# Patient Record
Sex: Female | Born: 1970 | Race: Black or African American | Hispanic: No | Smoking: Current every day smoker
Health system: Southern US, Community
[De-identification: ages and names within clinical notes are randomized; demographics above are authoritative.]

## PROBLEM LIST (undated history)

## (undated) DIAGNOSIS — F419 Anxiety disorder, unspecified: Secondary | ICD-10-CM

## (undated) DIAGNOSIS — F32A Depression, unspecified: Secondary | ICD-10-CM

## (undated) DIAGNOSIS — I10 Essential (primary) hypertension: Secondary | ICD-10-CM

## (undated) DIAGNOSIS — M545 Low back pain, unspecified: Secondary | ICD-10-CM

## (undated) DIAGNOSIS — M25511 Pain in right shoulder: Secondary | ICD-10-CM

## (undated) DIAGNOSIS — F329 Major depressive disorder, single episode, unspecified: Secondary | ICD-10-CM

## (undated) DIAGNOSIS — G8929 Other chronic pain: Secondary | ICD-10-CM

## (undated) DIAGNOSIS — G4730 Sleep apnea, unspecified: Secondary | ICD-10-CM

## (undated) DIAGNOSIS — R3915 Urgency of urination: Secondary | ICD-10-CM

## (undated) DIAGNOSIS — R519 Headache, unspecified: Secondary | ICD-10-CM

## (undated) DIAGNOSIS — R51 Headache: Secondary | ICD-10-CM

## (undated) HISTORY — DX: Anxiety disorder, unspecified: F41.9

## (undated) HISTORY — PX: TUBAL LIGATION: SHX77

## (undated) HISTORY — DX: Major depressive disorder, single episode, unspecified: F32.9

## (undated) HISTORY — DX: Depression, unspecified: F32.A

## (undated) HISTORY — PX: OTHER SURGICAL HISTORY: SHX169

---

## 2003-01-31 ENCOUNTER — Other Ambulatory Visit: Admission: RE | Admit: 2003-01-31 | Discharge: 2003-01-31 | Payer: Self-pay | Admitting: Obstetrics and Gynecology

## 2005-02-16 ENCOUNTER — Other Ambulatory Visit: Admission: RE | Admit: 2005-02-16 | Discharge: 2005-02-16 | Payer: Self-pay | Admitting: Gynecology

## 2005-09-24 ENCOUNTER — Inpatient Hospital Stay (HOSPITAL_COMMUNITY): Admission: AD | Admit: 2005-09-24 | Discharge: 2005-09-24 | Payer: Self-pay | Admitting: Obstetrics and Gynecology

## 2005-11-22 ENCOUNTER — Inpatient Hospital Stay (HOSPITAL_COMMUNITY): Admission: AD | Admit: 2005-11-22 | Discharge: 2005-11-24 | Payer: Self-pay | Admitting: Obstetrics and Gynecology

## 2005-12-21 ENCOUNTER — Other Ambulatory Visit: Admission: RE | Admit: 2005-12-21 | Discharge: 2005-12-21 | Payer: Self-pay | Admitting: Obstetrics and Gynecology

## 2006-09-02 ENCOUNTER — Emergency Department (HOSPITAL_COMMUNITY): Admission: EM | Admit: 2006-09-02 | Discharge: 2006-09-03 | Payer: Self-pay | Admitting: Emergency Medicine

## 2014-01-10 ENCOUNTER — Emergency Department (HOSPITAL_COMMUNITY): Payer: Self-pay

## 2014-01-10 ENCOUNTER — Encounter (HOSPITAL_COMMUNITY): Payer: Self-pay | Admitting: Emergency Medicine

## 2014-01-10 DIAGNOSIS — M722 Plantar fascial fibromatosis: Secondary | ICD-10-CM | POA: Insufficient documentation

## 2014-01-10 DIAGNOSIS — I10 Essential (primary) hypertension: Secondary | ICD-10-CM | POA: Insufficient documentation

## 2014-01-10 DIAGNOSIS — F17200 Nicotine dependence, unspecified, uncomplicated: Secondary | ICD-10-CM | POA: Insufficient documentation

## 2014-01-10 NOTE — ED Notes (Signed)
Pt report pain that has progressively gotten worse over the last 2 weeks. Pain more noted to heel on L foot. Pt denies any injury to area. Pt alert and ambulatory.

## 2014-01-11 ENCOUNTER — Emergency Department (HOSPITAL_COMMUNITY)
Admission: EM | Admit: 2014-01-11 | Discharge: 2014-01-11 | Disposition: A | Discharge status: Home / Self Care | Payer: Self-pay | Attending: Emergency Medicine | Admitting: Emergency Medicine

## 2014-01-11 DIAGNOSIS — M722 Plantar fascial fibromatosis: Secondary | ICD-10-CM

## 2014-01-11 HISTORY — DX: Essential (primary) hypertension: I10

## 2014-01-11 MED ORDER — HYDROCODONE-ACETAMINOPHEN 5-325 MG PO TABS: 2.0000 | ORAL_TABLET | ORAL | Status: DC | PRN

## 2014-01-11 MED ORDER — IBUPROFEN 800 MG PO TABS: 800.0000 mg | ORAL_TABLET | Freq: Three times a day (TID) | ORAL | Status: DC

## 2014-01-11 NOTE — ED Provider Notes (Signed)
CSN: [id number]     Arrival date & time 01/10/14  2217 History   First MD Initiated Contact with Patient 01/11/14 0011     Chief Complaint  Patient presents with  . Foot Pain     (Consider location/radiation/quality/duration/timing/severity/associated sxs/prior Treatment) HPI History provided by pt.   Pt has had pain on plantar surface of L heel for the past 2 weeks.  Gradually worsening.  Most severe in am when she first bears weight.  No associated fever, skin changes, paresthesias. Denies trauma.  Has never had pain like this before.   Past Medical History  Diagnosis Date  . Hypertension    History reviewed. No pertinent past surgical history. History reviewed. No pertinent family history. History  Substance Use Topics  . Smoking status: Current Some Day Smoker    Types: Cigarettes  . Smokeless tobacco: Not on file  . Alcohol Use: Yes   OB History   Grav Para Term Preterm Abortions TAB SAB Ect Mult Living                 Review of Systems  All other systems reviewed and are negative.      Allergies  Review of patient's allergies indicates not on file.  Home Medications   Current Outpatient Rx  Name  Route  Sig  Dispense  Refill  . HYDROcodone-acetaminophen (NORCO/VICODIN) 5-325 MG per tablet   Oral   Take 2 tablets by mouth every 4 (four) hours as needed.   20 tablet   0   . ibuprofen (ADVIL,MOTRIN) 800 MG tablet   Oral   Take 1 tablet (800 mg total) by mouth 3 (three) times daily.   12 tablet   0    BP 124/78  Pulse 73  Temp(Src) 98.5 F (36.9 C) (Oral)  Resp 20  SpO2 98%  LMP 12/20/2013 Physical Exam  Nursing note and vitals reviewed. Constitutional: She is oriented to person, place, and time. She appears well-developed and well-nourished. No distress.  HENT:  Head: Normocephalic and atraumatic.  Eyes:  Normal appearance  Neck: Normal range of motion.  Pulmonary/Chest: Effort normal.  Musculoskeletal: Normal range of motion.  No skin  changes or edema L foot.  Nml arch. Tenderness plantar surface of L foot from calcaneous to base of metatarsals.  Pain w/ passive dorsiflexion.  2+ DP pulse and distal sensation intact.    Neurological: She is alert and oriented to person, place, and time.  Psychiatric: She has a normal mood and affect. Her behavior is normal.    ED Course  Procedures (including critical care time) Labs Review Labs Reviewed - No data to display Imaging Review Dg Foot Complete Left  01/10/2014   CLINICAL DATA:  Foot pain, worse at heel.  EXAM: LEFT FOOT - COMPLETE 3+ VIEW  COMPARISON:  None available.  FINDINGS: There is no evidence of fracture or dislocation. There is no evidence of arthropathy or other focal bone abnormality. Soft tissues are unremarkable. Posterior and plantar calcaneal enthesophytes are noted  IMPRESSION: 1. No acute fracture or dislocation. 2. Posterior and plantar calcaneal degenerative enthesophytes.   Electronically Signed   By: Rise MuBenjamin  McClintock M.D.   On: 01/10/2014 23:48     EKG Interpretation None      MDM   Final diagnoses:  Plantar fasciitis    43yo F presents w/ non-traumatic L foot pain x 1 week.  History and exam most consistent w/ plantar fasciitis.  Xray shows bone spurs of calcaneous.  Prescribed  vicodin for severe pain and recommended NSAID, ice, stretching exercises, supportive footwear.  Ortho tech provided her with crutches as well. Referred to ortho for persistent/worsening sx.      Otilio Miu, PA-C 01/11/14 [phone]

## 2014-01-11 NOTE — Discharge Instructions (Signed)
Take vicodin as prescribed for severe pain.  Do not drive within four hours of taking this medication (may cause drowsiness or confusion).  Take ibuprofen as well; up to 800mg  three times a day with food.   Ice 2-3 times a day for 15-20 minutes. Elevate as often as possible.  Wear supportive shoes at all times.  Follow up with the orthopedist if your pain has not started to improve in 5-7 days, or you develop weakness of the injured joint.    You may return to the ER if symptoms worsen or you have any other concerns.    Plantar Fasciitis (Heel Spur Syndrome) with Rehab The plantar fascia is a fibrous, ligament-like, soft-tissue structure that spans the bottom of the foot. Plantar fasciitis is a condition that causes pain in the foot due to inflammation of the tissue. SYMPTOMS   Pain and tenderness on the underneath side of the foot.  Pain that worsens with standing or walking. CAUSES  Plantar fasciitis is caused by irritation and injury to the plantar fascia on the underneath side of the foot. Common mechanisms of injury include:  Direct trauma to bottom of the foot.  Damage to a small nerve that runs under the foot where the main fascia attaches to the heel bone.  Stress placed on the plantar fascia due to bone spurs. RISK INCREASES WITH:   Activities that place stress on the plantar fascia (running, jumping, pivoting, or cutting).  Poor strength and flexibility.  Improperly fitted shoes.  Tight calf muscles.  Flat feet.  Failure to warm-up properly before activity.  Obesity. PREVENTION  Warm up and stretch properly before activity.  Allow for adequate recovery between workouts.  Maintain physical fitness:  Strength, flexibility, and endurance.  Cardiovascular fitness.  Maintain a health body weight.  Avoid stress on the plantar fascia.  Wear properly fitted shoes, including arch supports for individuals who have flat feet. PROGNOSIS  If treated properly, then the  symptoms of plantar fasciitis usually resolve without surgery. However, occasionally surgery is necessary. RELATED COMPLICATIONS   Recurrent symptoms that may result in a chronic condition.  Problems of the lower back that are caused by compensating for the injury, such as limping.  Pain or weakness of the foot during push-off following surgery.  Chronic inflammation, scarring, and partial or complete fascia tear, occurring more often from repeated injections. TREATMENT  Treatment initially involves the use of ice and medication to help reduce pain and inflammation. The use of strengthening and stretching exercises may help reduce pain with activity, especially stretches of the Achilles tendon. These exercises may be performed at home or with a therapist. Your caregiver may recommend that you use heel cups of arch supports to help reduce stress on the plantar fascia. Occasionally, corticosteroid injections are given to reduce inflammation. If symptoms persist for greater than 6 months despite non-surgical (conservative), then surgery may be recommended.  MEDICATION   If pain medication is necessary, then nonsteroidal anti-inflammatory medications, such as aspirin and ibuprofen, or other minor pain relievers, such as acetaminophen, are often recommended.  Do not take pain medication within 7 days before surgery.  Prescription pain relievers may be given if deemed necessary by your caregiver. Use only as directed and only as much as you need.  Corticosteroid injections may be given by your caregiver. These injections should be reserved for the most serious cases, because they may only be given a certain number of times. HEAT AND COLD  Cold treatment (icing) relieves  pain and reduces inflammation. Cold treatment should be applied for 10 to 15 minutes every 2 to 3 hours for inflammation and pain and immediately after any activity that aggravates your symptoms. Use ice packs or massage the area  with a piece of ice (ice massage).  Heat treatment may be used prior to performing the stretching and strengthening activities prescribed by your caregiver, physical therapist, or athletic trainer. Use a heat pack or soak the injury in warm water. SEEK IMMEDIATE MEDICAL CARE IF:  Treatment seems to offer no benefit, or the condition worsens.  Any medications produce adverse side effects. EXERCISES RANGE OF MOTION (ROM) AND STRETCHING EXERCISES - Plantar Fasciitis (Heel Spur Syndrome) These exercises may help you when beginning to rehabilitate your injury. Your symptoms may resolve with or without further involvement from your physician, physical therapist or athletic trainer. While completing these exercises, remember:   Restoring tissue flexibility helps normal motion to return to the joints. This allows healthier, less painful movement and activity.  An effective stretch should be held for at least 30 seconds.  A stretch should never be painful. You should only feel a gentle lengthening or release in the stretched tissue. RANGE OF MOTION - Toe Extension, Flexion  Sit with your right / left leg crossed over your opposite knee.  Grasp your toes and gently pull them back toward the top of your foot. You should feel a stretch on the bottom of your toes and/or foot.  Hold this stretch for __________ seconds.  Now, gently pull your toes toward the bottom of your foot. You should feel a stretch on the top of your toes and or foot.  Hold this stretch for __________ seconds. Repeat __________ times. Complete this stretch __________ times per day.  RANGE OF MOTION - Ankle Dorsiflexion, Active Assisted  Remove shoes and sit on a chair that is preferably not on a carpeted surface.  Place right / left foot under knee. Extend your opposite leg for support.  Keeping your heel down, slide your right / left foot back toward the chair until you feel a stretch at your ankle or calf. If you do not  feel a stretch, slide your bottom forward to the edge of the chair, while still keeping your heel down.  Hold this stretch for __________ seconds. Repeat __________ times. Complete this stretch __________ times per day.  STRETCH  Gastroc, Standing  Place hands on wall.  Extend right / left leg, keeping the front knee somewhat bent.  Slightly point your toes inward on your back foot.  Keeping your right / left heel on the floor and your knee straight, shift your weight toward the wall, not allowing your back to arch.  You should feel a gentle stretch in the right / left calf. Hold this position for __________ seconds. Repeat __________ times. Complete this stretch __________ times per day. STRETCH  Soleus, Standing  Place hands on wall.  Extend right / left leg, keeping the other knee somewhat bent.  Slightly point your toes inward on your back foot.  Keep your right / left heel on the floor, bend your back knee, and slightly shift your weight over the back leg so that you feel a gentle stretch deep in your back calf.  Hold this position for __________ seconds. Repeat __________ times. Complete this stretch __________ times per day. STRETCH  Gastrocsoleus, Standing  Note: This exercise can place a lot of stress on your foot and ankle. Please complete this exercise  only if specifically instructed by your caregiver.   Place the ball of your right / left foot on a step, keeping your other foot firmly on the same step.  Hold on to the wall or a rail for balance.  Slowly lift your other foot, allowing your body weight to press your heel down over the edge of the step.  You should feel a stretch in your right / left calf.  Hold this position for __________ seconds.  Repeat this exercise with a slight bend in your right / left knee. Repeat __________ times. Complete this stretch __________ times per day.  STRENGTHENING EXERCISES - Plantar Fasciitis (Heel Spur Syndrome)  These  exercises may help you when beginning to rehabilitate your injury. They may resolve your symptoms with or without further involvement from your physician, physical therapist or athletic trainer. While completing these exercises, remember:   Muscles can gain both the endurance and the strength needed for everyday activities through controlled exercises.  Complete these exercises as instructed by your physician, physical therapist or athletic trainer. Progress the resistance and repetitions only as guided. STRENGTH - Towel Curls  Sit in a chair positioned on a non-carpeted surface.  Place your foot on a towel, keeping your heel on the floor.  Pull the towel toward your heel by only curling your toes. Keep your heel on the floor.  If instructed by your physician, physical therapist or athletic trainer, add ____________________ at the end of the towel. Repeat __________ times. Complete this exercise __________ times per day. STRENGTH - Ankle Inversion  Secure one end of a rubber exercise band/tubing to a fixed object (table, pole). Loop the other end around your foot just before your toes.  Place your fists between your knees. This will focus your strengthening at your ankle.  Slowly, pull your big toe up and in, making sure the band/tubing is positioned to resist the entire motion.  Hold this position for __________ seconds.  Have your muscles resist the band/tubing as it slowly pulls your foot back to the starting position. Repeat __________ times. Complete this exercises __________ times per day.  Document Released: 10/04/2005 Document Revised: 12/27/2011 Document Reviewed: 01/16/2009 [facility] Patient Information 2014 [place], [place].

## 2014-01-11 NOTE — ED Provider Notes (Signed)
Medical screening examination/treatment/procedure(s) were performed by non-physician practitioner and as supervising physician I was immediately available for consultation/collaboration.   EKG Interpretation None       Elis Rawlinson, MD 01/11/14 2219 

## 2014-01-27 ENCOUNTER — Encounter (HOSPITAL_COMMUNITY): Payer: Self-pay | Admitting: Emergency Medicine

## 2014-01-27 ENCOUNTER — Emergency Department (HOSPITAL_COMMUNITY)
Admission: EM | Admit: 2014-01-27 | Discharge: 2014-01-27 | Disposition: A | Discharge status: Home / Self Care | Payer: Self-pay | Attending: Emergency Medicine | Admitting: Emergency Medicine

## 2014-01-27 DIAGNOSIS — N898 Other specified noninflammatory disorders of vagina: Secondary | ICD-10-CM | POA: Insufficient documentation

## 2014-01-27 DIAGNOSIS — F17200 Nicotine dependence, unspecified, uncomplicated: Secondary | ICD-10-CM | POA: Insufficient documentation

## 2014-01-27 DIAGNOSIS — Z79899 Other long term (current) drug therapy: Secondary | ICD-10-CM | POA: Insufficient documentation

## 2014-01-27 DIAGNOSIS — R21 Rash and other nonspecific skin eruption: Secondary | ICD-10-CM

## 2014-01-27 DIAGNOSIS — R300 Dysuria: Secondary | ICD-10-CM

## 2014-01-27 DIAGNOSIS — I10 Essential (primary) hypertension: Secondary | ICD-10-CM | POA: Insufficient documentation

## 2014-01-27 DIAGNOSIS — R109 Unspecified abdominal pain: Secondary | ICD-10-CM | POA: Insufficient documentation

## 2014-01-27 LAB — URINALYSIS, ROUTINE W REFLEX MICROSCOPIC
BILIRUBIN URINE: NEGATIVE
Glucose, UA: NEGATIVE mg/dL
Hgb urine dipstick: NEGATIVE
KETONES UR: NEGATIVE mg/dL
Leukocytes, UA: NEGATIVE
NITRITE: NEGATIVE
Protein, ur: NEGATIVE mg/dL
Specific Gravity, Urine: 1.025 (ref 1.005–1.030)
UROBILINOGEN UA: 0.2 mg/dL (ref 0.0–1.0)
pH: 6 (ref 5.0–8.0)

## 2014-01-27 LAB — WET PREP, GENITAL
Clue Cells Wet Prep HPF POC: NONE SEEN
Trich, Wet Prep: NONE SEEN
Yeast Wet Prep HPF POC: NONE SEEN

## 2014-01-27 MED ORDER — NYSTATIN 100000 UNIT/GM EX POWD: CUTANEOUS | Status: DC

## 2014-01-27 NOTE — Discharge Instructions (Signed)
Dysuria Dysuria is the medical term for pain with urination. There are many causes for dysuria, but urinary tract infection is the most common. If a urinalysis was performed it can show that there is a urinary tract infection. A urine culture confirms that you or your child is sick. You will need to follow up with a healthcare provider because:  If a urine culture was done you will need to know the culture results and treatment recommendations.  If the urine culture was positive, you or your child will need to be put on antibiotics or know if the antibiotics prescribed are the right antibiotics for your urinary tract infection.  If the urine culture is negative (no urinary tract infection), then other causes may need to be explored or antibiotics need to be stopped. Today laboratory work may have been done and there does not seem to be an infection. If cultures were done they will take at least 24 to 48 hours to be completed. Today x-rays may have been taken and they read as normal. No cause can be found for the problems. The x-rays may be re-read by a radiologist and you will be contacted if additional findings are made. You or your child may have been put on medications to help with this problem until you can see your primary caregiver. If the problems get better, see your primary caregiver if the problems return. If you were given antibiotics (medications which kill germs), take all of the mediations as directed for the full course of treatment.  If laboratory work was done, you need to find the results. Leave a telephone number where you can be reached. If this is not possible, make sure you find out how you are to get test results. HOME CARE INSTRUCTIONS   Drink lots of fluids. For adults, drink eight, 8 ounce glasses of clear juice or water a day. For children, replace fluids as suggested by your caregiver.  Empty the bladder often. Avoid holding urine for long periods of time.  After a bowel  movement, women should cleanse front to back, using each tissue only once.  Empty your bladder before and after sexual intercourse.  Take all the medicine given to you until it is gone. You may feel better in a few days, but TAKE ALL MEDICINE.  Avoid caffeine, tea, alcohol and carbonated beverages, because they tend to irritate the bladder.  In men, alcohol may irritate the prostate.  Only take over-the-counter or prescription medicines for pain, discomfort, or fever as directed by your caregiver.  If your caregiver has given you a follow-up appointment, it is very important to keep that appointment. Not keeping the appointment could result in a chronic or permanent injury, pain, and disability. If there is any problem keeping the appointment, you must call back to this facility for assistance. SEEK IMMEDIATE MEDICAL CARE IF:   Back pain develops.  A fever develops.  There is nausea (feeling sick to your stomach) or vomiting (throwing up).  Problems are no better with medications or are getting worse. MAKE SURE YOU:   Understand these instructions.  Will watch your condition.  Will get help right away if you are not doing well or get worse. Document Released: 07/02/2004 Document Revised: 12/27/2011 Document Reviewed: 05/09/2008 [facility] Patient Information 2014 [place], [place].      Cutaneous Candidiasis Cutaneous candidiasis is a condition in which there is an overgrowth of yeast (candida) on the skin. Yeast normally live on the skin, but in small  enough numbers not to cause any symptoms. In certain cases, increased growth of the yeast may cause an actual yeast infection. This kind of infection usually occurs in areas of the skin that are constantly warm and moist, such as the armpits or the groin. Yeast is the most common cause of diaper rash in babies and in people who cannot control their bowel movements (incontinence). CAUSES  The fungus that most often causes cutaneous  candidiasis is Candida albicans. Conditions that can increase the risk of getting a yeast infection of the skin include:  Obesity.  Pregnancy.  Diabetes.  Taking antibiotic medicine.  Taking birth control pills.  Taking steroid medicines.  Thyroid disease.  An iron or zinc deficiency.  Problems with the immune system. SYMPTOMS   Red, swollen area of the skin.  Bumps on the skin.  Itchiness. DIAGNOSIS  The diagnosis of cutaneous candidiasis is usually based on its appearance. Light scrapings of the skin may also be taken and viewed under a microscope to identify the presence of yeast. TREATMENT  Antifungal creams may be applied to the infected skin. In severe cases, oral medicines may be needed.  HOME CARE INSTRUCTIONS   Keep your skin clean and dry.  Maintain a healthy weight.  If you have diabetes, keep your blood sugar under control. SEEK IMMEDIATE MEDICAL CARE IF:  Your rash continues to spread despite treatment.  You have a fever, chills, or abdominal pain. Document Released: 06/22/2011 Document Revised: 12/27/2011 Document Reviewed: 06/22/2011 [facility] Patient Information 2014 [place], [place].

## 2014-01-27 NOTE — ED Provider Notes (Signed)
CSN: [id number]     Arrival date & time 01/27/14  1238 History   First MD Initiated Contact with Patient 01/27/14 1258     Chief Complaint  Patient presents with  . Flank Pain  . Dysuria     (Consider location/radiation/quality/duration/timing/severity/associated sxs/prior Treatment) HPI Comments: Pt reports she is separated but has been with same partner for a few years  She has had yeast infection in the past, but no STD.  She had a BTL in the past, then had it reversed  Patient is a 43 y.o. female presenting with flank pain and dysuria. The history is provided by the patient.  Flank Pain This is a new problem. The current episode started more than 2 days ago. The problem occurs constantly. The problem has not changed since onset.Pertinent negatives include no chest pain, no abdominal pain, no headaches and no shortness of breath. Associated symptoms comments: Dysuria and rash on genitalia.  Dysuria Pain quality:  Burning Onset quality:  Gradual Duration:  1 day Timing:  Constant Progression:  Unchanged Chronicity:  New Recent urinary tract infections: no (last was about 2-3 years ago)   Relieved by:  Nothing Worsened by:  Nothing tried Ineffective treatments:  None tried Urinary symptoms: no hematuria   Urinary symptoms comment:  Pt has freq urination at baseline, not different Associated symptoms: flank pain   Associated symptoms: no abdominal pain, no fever, no nausea, no vaginal discharge and no vomiting   Risk factors: recurrent urinary tract infections and sexually active   Risk factors: no hx of pyelonephritis, not pregnant, no renal disease, no sexually transmitted infections and no urinary catheter     Past Medical History  Diagnosis Date  . Hypertension    History reviewed. No pertinent past surgical history. History reviewed. No pertinent family history. History  Substance Use Topics  . Smoking status: Current Some Day Smoker    Types: Cigarettes  . Smokeless  tobacco: Not on file  . Alcohol Use: Yes   OB History   Grav Para Term Preterm Abortions TAB SAB Ect Mult Living                 Review of Systems  Constitutional: Negative for fever.  Respiratory: Negative for shortness of breath.   Cardiovascular: Negative for chest pain.  Gastrointestinal: Negative for nausea, vomiting and abdominal pain.  Genitourinary: Positive for dysuria and flank pain. Negative for urgency, hematuria, vaginal bleeding, vaginal discharge, difficulty urinating, vaginal pain, menstrual problem and dyspareunia.  Skin: Positive for rash.  Neurological: Negative for headaches.  All other systems reviewed and are negative.     Allergies  Review of patient's allergies indicates no known allergies.  Home Medications   Current Outpatient Rx  Name  Route  Sig  Dispense  Refill  . gabapentin (NEURONTIN) 300 MG capsule   Oral   Take 300 mg by mouth at bedtime as needed (for nerve pain in foot).         Marland Kitchen. ibuprofen (ADVIL,MOTRIN) 200 MG tablet   Oral   Take 800 mg by mouth every 6 (six) hours as needed for moderate pain.         Marland Kitchen. HYDROcodone-acetaminophen (NORCO/VICODIN) 5-325 MG per tablet   Oral   Take 2 tablets by mouth every 4 (four) hours as needed.   20 tablet   0   . ibuprofen (ADVIL,MOTRIN) 800 MG tablet   Oral   Take 1 tablet (800 mg total) by mouth 3 (three) times  daily.   12 tablet   0   . nystatin (MYCOSTATIN/NYSTOP) 100000 UNIT/GM POWD      Apply to affected area three times per day for up to three weeks.   Otherwise keep area clean and dry   30 g   0    BP 111/66  Pulse 58  Temp(Src) 97.6 F (36.4 C) (Oral)  Resp 12  SpO2 99%  LMP 12/20/2013 Physical Exam  Nursing note and vitals reviewed. Constitutional: She appears well-developed and well-nourished. No distress.  HENT:  Head: Normocephalic and atraumatic.  Eyes: Conjunctivae are normal. No scleral icterus.  Neck: Neck supple.  Cardiovascular: Normal rate, regular  rhythm and intact distal pulses.   Pulmonary/Chest: Effort normal.  Abdominal: Soft. She exhibits no distension. There is no tenderness. There is no rebound.  Genitourinary: Cervix exhibits no motion tenderness and no discharge. Right adnexum displays no tenderness. Left adnexum displays no tenderness. No tenderness around the vagina. Vaginal discharge found.  Chaperone present.  Erythema, wet shiny appearence of vulva and inner thigh worse on right than left  Neurological: She is alert.  Skin: Skin is warm. She is not diaphoretic.       ED Course  Procedures (including critical care time) Labs Review Labs Reviewed  WET PREP, GENITAL - Abnormal; Notable for the following:    WBC, Wet Prep HPF POC FEW (*)    All other components within normal limits  GC/CHLAMYDIA PROBE AMP  URINE CULTURE  URINALYSIS, ROUTINE W REFLEX MICROSCOPIC   Imaging Review No results found.   EKG Interpretation None     RA sat is 99% and I interpret to be adequate  3:05 PM Wet prep is unremarkable.  Will give nystatin powder to use, urine culture added to labs.  Pt understands other pelvic exam cultures are still pending.    MDM   Final diagnoses:  Rash  Dysuria    Pt with some right side low back pain and dysuria, more at beginning of stream associated with external rash, possibly yeast infection.  UA is normal so I suspect dysuria is more from irritation of mucous membrane skin than from UTI.  Wet prep and GC/Chl pending.      Gavin Pound. Harlie Buening, MD 01/27/14 1505

## 2014-01-27 NOTE — ED Notes (Signed)
She c/o right flank pain plus dysuria x 2 days.  She denies fever/n/v/d/vag. D/c. And is in no distress.

## 2014-01-27 NOTE — ED Notes (Addendum)
Pt ambulated to restroom with steady gait.

## 2014-01-28 LAB — GC/CHLAMYDIA PROBE AMP
CT Probe RNA: NEGATIVE
GC Probe RNA: NEGATIVE

## 2014-01-29 LAB — URINE CULTURE
Colony Count: NO GROWTH
Culture: NO GROWTH

## 2015-11-10 HISTORY — PX: ROTATOR CUFF REPAIR: SHX139

## 2016-01-19 ENCOUNTER — Other Ambulatory Visit: Payer: Self-pay | Admitting: Obstetrics and Gynecology

## 2016-01-19 DIAGNOSIS — R928 Other abnormal and inconclusive findings on diagnostic imaging of breast: Secondary | ICD-10-CM

## 2016-01-26 ENCOUNTER — Ambulatory Visit
Admission: RE | Admit: 2016-01-26 | Discharge: 2016-01-26 | Disposition: A | Discharge status: Home / Self Care | Payer: Managed Care, Other (non HMO) | Source: Ambulatory Visit | Attending: Obstetrics and Gynecology | Admitting: Obstetrics and Gynecology

## 2016-01-26 DIAGNOSIS — R928 Other abnormal and inconclusive findings on diagnostic imaging of breast: Secondary | ICD-10-CM

## 2017-01-12 ENCOUNTER — Encounter: Payer: Self-pay | Admitting: Vascular Surgery

## 2017-01-24 ENCOUNTER — Encounter: Payer: Self-pay | Admitting: Vascular Surgery

## 2017-01-24 ENCOUNTER — Ambulatory Visit (INDEPENDENT_AMBULATORY_CARE_PROVIDER_SITE_OTHER): Payer: Medicaid Other | Admitting: Vascular Surgery

## 2017-01-24 VITALS — BP 109/69 | HR 72 | Temp 97.5°F | Resp 18 | Ht 71.5 in | Wt 307.1 lb

## 2017-01-24 DIAGNOSIS — I8393 Asymptomatic varicose veins of bilateral lower extremities: Secondary | ICD-10-CM | POA: Insufficient documentation

## 2017-01-24 DIAGNOSIS — I83893 Varicose veins of bilateral lower extremities with other complications: Secondary | ICD-10-CM | POA: Insufficient documentation

## 2017-01-24 NOTE — Progress Notes (Signed)
Subjective:     Patient ID: Wendy George, female   DOB: [birth year], 46 y.o.   MRN: [id number]  HPI This 46 year old female is a self-referral to be evaluated for bilateral varicose veins left worse than right. She has had no previous treatment. She states that she noted broken veins in her lateral thigh areas left worse than right 7 years ago. She has developed some aching discomfort in the left lateral thigh and posterior calf which worsens as the day progresses. She has no distal edema initially but does develop some mild edema as the day goes by. She has no history of DVT thrombophlebitis stasis ulcers or bleeding. He has never had previous evaluation.  Past Medical History:  Diagnosis Date  . Hypertension     Social History  Substance Use Topics  . Smoking status: Current Some Day Smoker    Types: Cigarettes  . Smokeless tobacco: Never Used     Comment: 1-2 cigarettes/day  . Alcohol use Yes    History reviewed. No pertinent family history.  No Known Allergies   Current Outpatient Prescriptions:  .  DULoxetine (CYMBALTA) 60 MG capsule, Take 60 mg by mouth daily., Disp: , Rfl:  .  meloxicam (MOBIC) 15 MG tablet, Take 15 mg by mouth at bedtime., Disp: , Rfl:  .  pregabalin (LYRICA) 150 MG capsule, Take 150 mg by mouth 2 (two) times daily., Disp: , Rfl:  .  traMADol (ULTRAM) 50 MG tablet, Take by mouth every 6 (six) hours as needed., Disp: , Rfl:  .  gabapentin (NEURONTIN) 300 MG capsule, Take 300 mg by mouth at bedtime as needed (for nerve pain in foot)., Disp: , Rfl:  .  HYDROcodone-acetaminophen (NORCO/VICODIN) 5-325 MG per tablet, Take 2 tablets by mouth every 4 (four) hours as needed. (Patient not taking: Reported on 01/24/2017), Disp: 20 tablet, Rfl: 0 .  ibuprofen (ADVIL,MOTRIN) 200 MG tablet, Take 800 mg by mouth every 6 (six) hours as needed for moderate pain., Disp: , Rfl:  .  ibuprofen (ADVIL,MOTRIN) 800 MG tablet, Take 1 tablet (800 mg total) by mouth 3 (three) times  daily. (Patient not taking: Reported on 01/24/2017), Disp: 12 tablet, Rfl: 0 .  nystatin (MYCOSTATIN/NYSTOP) 100000 UNIT/GM POWD, Apply to affected area three times per day for up to three weeks.   Otherwise keep area clean and dry (Patient not taking: Reported on 01/24/2017), Disp: 30 g, Rfl: 0  Vitals:   01/24/17 1312  BP: 109/69  Pulse: 72  Resp: 18  Temp: 97.5 F (36.4 C)  TempSrc: Oral  SpO2: 97%  Weight: (!) 307 lb 1.6 oz (139.3 kg)  Height: 5' 11.5" (1.816 m)    Body mass index is 42.23 kg/m.         Review of Systems Denies chest pain, dyspnea on exertion, PND, orthopnea, hemoptysis.    Objective:   Physical Exam BP 109/69 (BP Location: Left Arm, Patient Position: Sitting, Cuff Size: Large)   Pulse 72   Temp 97.5 F (36.4 C) (Oral)   Resp 18   Ht 5' 11.5" (1.816 m)   Wt (!) 307 lb 1.6 oz (139.3 kg)   SpO2 97%   BMI 42.23 kg/m     Gen.-alert and oriented x3 in no apparent distress-obese HEENT normal for age Lungs no rhonchi or wheezing Cardiovascular regular rhythm no murmurs carotid pulses 3+ palpable no bruits audible Abdomen soft nontender no palpable masses-obese Musculoskeletal free of  major deformities Skin clear -no rashes Neurologic normal Lower extremities  3+ femoral and dorsalis pedis pulses palpable bilaterally with no edema Extensive spider veins mid left lateral thigh and a few in the posterior calf. Lesser degree in right lateral thigh. No bulging varicosities hyperpigmentation ulceration or other stigmata of venous disease noted.  Today I performed a bedside SonoSite ultrasound exam and visualized both great saphenous veins which appeared normal with no evidence of reflux       Assessment:     #1 bilateral spider veins #2 hypertension    Plan:     Discussed with patient the treatment for spider veins is foam sclerotherapy and the pros and cons of this If she should decide she would like to consider this further she will be in touch  with Marisue Ivan. Sherene Sires

## 2017-05-30 ENCOUNTER — Other Ambulatory Visit: Payer: Self-pay | Admitting: Internal Medicine

## 2017-05-30 DIAGNOSIS — Z1231 Encounter for screening mammogram for malignant neoplasm of breast: Secondary | ICD-10-CM

## 2017-06-10 ENCOUNTER — Ambulatory Visit: Payer: No Typology Code available for payment source

## 2017-06-22 ENCOUNTER — Ambulatory Visit
Admission: RE | Admit: 2017-06-22 | Discharge: 2017-06-22 | Disposition: A | Discharge status: Home / Self Care | Payer: BLUE CROSS/BLUE SHIELD | Source: Ambulatory Visit | Attending: Internal Medicine | Admitting: Internal Medicine

## 2017-06-22 DIAGNOSIS — Z1231 Encounter for screening mammogram for malignant neoplasm of breast: Secondary | ICD-10-CM

## 2018-06-23 ENCOUNTER — Other Ambulatory Visit (HOSPITAL_COMMUNITY): Payer: Self-pay | Admitting: Surgery

## 2018-06-28 ENCOUNTER — Other Ambulatory Visit (HOSPITAL_COMMUNITY)
Admission: RE | Admit: 2018-06-28 | Discharge: 2018-06-28 | Disposition: A | Discharge status: Home / Self Care | Payer: BLUE CROSS/BLUE SHIELD | Source: Ambulatory Visit | Attending: Surgery | Admitting: Surgery

## 2018-06-28 LAB — COMPREHENSIVE METABOLIC PANEL
ALT: 15 U/L (ref 0–44)
AST: 18 U/L (ref 15–41)
Albumin: 4.1 g/dL (ref 3.5–5.0)
Alkaline Phosphatase: 49 U/L (ref 38–126)
Anion gap: 9 (ref 5–15)
BUN: 11 mg/dL (ref 6–20)
CO2: 27 mmol/L (ref 22–32)
Calcium: 9.3 mg/dL (ref 8.9–10.3)
Chloride: 107 mmol/L (ref 98–111)
Creatinine, Ser: 0.88 mg/dL (ref 0.44–1.00)
Glucose, Bld: 99 mg/dL (ref 70–99)
Potassium: 3.7 mmol/L (ref 3.5–5.1)
Sodium: 143 mmol/L (ref 135–145)
TOTAL PROTEIN: 6.8 g/dL (ref 6.5–8.1)
Total Bilirubin: 0.4 mg/dL (ref 0.3–1.2)

## 2018-06-28 LAB — IRON AND TIBC
Iron: 47 ug/dL (ref 28–170)
Saturation Ratios: 14 % (ref 10.4–31.8)
TIBC: 340 ug/dL (ref 250–450)
UIBC: 293 ug/dL

## 2018-06-28 LAB — VITAMIN B12: VITAMIN B 12: 758 pg/mL (ref 180–914)

## 2018-06-28 LAB — CBC WITH DIFFERENTIAL/PLATELET
BASOS ABS: 0 10*3/uL (ref 0.0–0.1)
Basophils Relative: 1 %
EOS PCT: 1 %
Eosinophils Absolute: 0.1 10*3/uL (ref 0.0–0.7)
HCT: 40.7 % (ref 36.0–46.0)
Hemoglobin: 13.8 g/dL (ref 12.0–15.0)
Lymphocytes Relative: 35 %
Lymphs Abs: 2.1 10*3/uL (ref 0.7–4.0)
MCH: 29.9 pg (ref 26.0–34.0)
MCHC: 33.9 g/dL (ref 30.0–36.0)
MCV: 88.1 fL (ref 78.0–100.0)
MONOS PCT: 6 %
Monocytes Absolute: 0.3 10*3/uL (ref 0.1–1.0)
Neutro Abs: 3.5 10*3/uL (ref 1.7–7.7)
Neutrophils Relative %: 57 %
PLATELETS: 282 10*3/uL (ref 150–400)
RBC: 4.62 MIL/uL (ref 3.87–5.11)
RDW: 14 % (ref 11.5–15.5)
WBC: 6.1 10*3/uL (ref 4.0–10.5)

## 2018-06-28 LAB — TSH: TSH: 0.869 u[IU]/mL (ref 0.350–4.500)

## 2018-06-29 LAB — HEMOGLOBIN A1C
HEMOGLOBIN A1C: 5.7 % — AB (ref 4.8–5.6)
Mean Plasma Glucose: 116.89 mg/dL

## 2018-06-30 LAB — VITAMIN D 25 HYDROXY (VIT D DEFICIENCY, FRACTURES): VIT D 25 HYDROXY: 14.2 ng/mL — AB (ref 30.0–100.0)

## 2018-07-03 LAB — VITAMIN D 1,25 DIHYDROXY
Vitamin D 1, 25 (OH)2 Total: 45 pg/mL
Vitamin D2 1, 25 (OH)2: <10 pg/mL
Vitamin D3 1, 25 (OH)2: 37 pg/mL

## 2018-07-10 ENCOUNTER — Ambulatory Visit (HOSPITAL_COMMUNITY)
Admission: RE | Admit: 2018-07-10 | Discharge: 2018-07-10 | Disposition: A | Discharge status: Home / Self Care | Payer: BLUE CROSS/BLUE SHIELD | Source: Ambulatory Visit | Attending: Surgery | Admitting: Surgery

## 2018-07-10 ENCOUNTER — Other Ambulatory Visit: Payer: Self-pay

## 2018-07-12 ENCOUNTER — Ambulatory Visit (INDEPENDENT_AMBULATORY_CARE_PROVIDER_SITE_OTHER): Payer: BLUE CROSS/BLUE SHIELD | Admitting: Neurology

## 2018-07-12 ENCOUNTER — Encounter: Payer: Self-pay | Admitting: Neurology

## 2018-07-12 VITALS — BP 117/69 | HR 67 | Ht 73.0 in | Wt 314.0 lb

## 2018-07-12 DIAGNOSIS — F5104 Psychophysiologic insomnia: Secondary | ICD-10-CM | POA: Diagnosis not present

## 2018-07-12 DIAGNOSIS — R0683 Snoring: Secondary | ICD-10-CM

## 2018-07-12 DIAGNOSIS — Z6841 Body Mass Index (BMI) 40.0 and over, adult: Secondary | ICD-10-CM

## 2018-07-12 DIAGNOSIS — R0689 Other abnormalities of breathing: Secondary | ICD-10-CM

## 2018-07-12 DIAGNOSIS — R0602 Shortness of breath: Secondary | ICD-10-CM

## 2018-07-12 NOTE — Patient Instructions (Signed)
Sleep Study  Wendy George   We will try to get an in lab sleep study approved .  You will arrive at 8 or 9 Pm and leave next morning between 5:00 a.m - 6:00 a.m.  The sleep study consists of a recording of your brain waves (EEG). Breathing, heart rate and rhythm (ECG), oxygen level, eye movement, and leg movement.  The technician will glue or or paste several electrodes to your scalp, face, chest and legs.  You will have belts around your chest and abdomen to record breathing and a finger clasp to check blood oxygen levels.  A tube at your mouth and nose will detect airflow.  There are no needle sticks or painful procedures of any sort.  You will have your own room, and we will make every effort to attend to your comfort and privacy.  Please prepare for your study by the following steps:   Please avoid coffee, tea, soda, chocolate and other caffeine foods or beverages after 12:00 noon on the day of your sleep study.   You must arrive with clean (no oils), conditioners or make up, and please make sure that you wash your hair to ensure that your hair and scalp are clean, dry and free of any hair extensions on the day of your study.  This will help to get a good reading of study.  Please try not to nap on the day of your study.  Please bring a list of all your medications.  Bring any medications that you might need during the time you are within the laboratory, including insulin, sleeping pills, pain medication and anxiety medications.  Bring snacks, water or juice  Please bring clothes to sleep in and your normal overnight bag.  Please leave valuable at home, as we will not be responsible for any lost items.  If you have any further questions, please feel free to call our office.   Thank you. Melvyn Novas, MD

## 2018-07-12 NOTE — Progress Notes (Signed)
SLEEP MEDICINE CLINIC   Provider:  Melvyn Novas, M D  Primary Care Physician:    Referring Provider: Phylliss Blakes, MD   Chief Complaint  Patient presents with  . New Patient (Initial Visit)    pt alone, rm 11. pt states that she is going to have gastric sleeve surgery and this is needed to prepare for that. pt states she snores in her sleep unsure of apnea events. never have a sleep study. pt states that she has woke up from sleep feeling like she has lost her breath.    HPI:  Wendy George is a 47 y.o. female patient of the bariatric surgery service at [facility] .Seen on 07-12-2018 as a NP for sleep apnea.   Chief complaint according to patient : " I know I am snoring, and my ex told me that I had apnea". "I have woken up with SOB, gasping " . Wendy George reports that her sleep is fragmented she wakes up frequently sometimes she will go to the bathroom but it is not the urge to urinate that wakes her.  She feels not restored and refreshed after sleeping.  She knows that she is snoring and has been told so that most recently she also had some gasping for breath events feeling short of breath and air hungry.  One night she has used her son's rescue inhaler as refilled unable to draw a deep breath. She has chronic insomnia and tried [place], xanax and now trazodone . She is interested in bariatric surgery after having failed multiple diets and lifestyle modifications.   Sleep habits are as follows:  Fluent work schedule at a [employer]. Typically at home by 6-8 PM, late dinner follows. 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  Children are 36, 7 and 22 daughter and 62 year old son .Bedtime will be at 11 Pm and often she already slept in front of the TV.  Bedroom is cool, quiet, and dark. No TV, no electronics. She sleeps finally around midnight for 3-4 hours before waking up and using that time to go to the bathroom. she feels her mind is racing after that - and she  puts the TV on.  Even if she takes a sleep aid (100 mg trazodone ) she is waking up but feels groggy. She rises at 6.00 AM after her alarm went off 3 times .   Sleep medical history and family sleep history: father has OSA, brother on CPAP.  Patient reported her paternal grandfather died of lung cancer, maternal aunt had colon cancer maternal grandfather had prostate cancer her father suffered from hypertension, diabetes mellitus and OSA, a paternal had colon cancer diabetes mellitus chronic kidney disease and maternal grandmother had atrial fibrillation, hypertension and hyperlipidemia. 6-8 year struggling with obesity , not pregnancy related. Son has asthma.   Social history: divorced , with 4 children ( has a granddaughter 35 years old ) , the 3 elder children are full siblings. quit smoking 4 month ago. ETOH - wine on weekends, not always. Caffeine ; coffee in AM, no energy drinks, no iced or hot tea, and not sodas.     Review of Systems: Out of a complete 14 system review, the patient complains of only the following symptoms, and all other reviewed systems are negative. Snoring, gasping, SOB, weight gain, fatigue/ struggling to stay awake - in meetings at work.  Epworth score of 10 points , Fatigue severity score 43/ 63 points   ,  depression score    Social History   Socioeconomic History  . Marital status: Legally Separated    Spouse name: Not on file  . Number of children: Not on file  . Years of education: Not on file  . Highest education level: Not on file  Occupational History  . Not on file  Social Needs  . Financial resource strain: Not on file  . Food insecurity:    Worry: Not on file    Inability: Not on file  . Transportation needs:    Medical: Not on file    Non-medical: Not on file  Tobacco Use  . Smoking status: Former Smoker    Types: Cigarettes    Last attempt to quit: 03/11/2018    Years since quitting: 0.3  . Smokeless tobacco: Never Used  . Tobacco  comment: 1-2 cigarettes/day/ quit may 2019  Substance and Sexual Activity  . Alcohol use: Yes  . Drug use: No  . Sexual activity: Not on file  Lifestyle  . Physical activity:    Days per week: Not on file    Minutes per session: Not on file  . Stress: Not on file  Relationships  . Social connections:    Talks on phone: Not on file    Gets together: Not on file    Attends religious service: Not on file    Active member of club or organization: Not on file    Attends meetings of clubs or organizations: Not on file    Relationship status: Not on file  . Intimate partner violence:    Fear of current or ex partner: Not on file    Emotionally abused: Not on file    Physically abused: Not on file    Forced sexual activity: Not on file  Other Topics Concern  . Not on file  Social History Narrative  . Not on file    Family History  Problem Relation Age of Onset  . Diabetes Father   . Hypertension Father   . Colon cancer Maternal Aunt   . Colon cancer Paternal Aunt   . Diabetes Paternal Aunt   . Hypertension Paternal Aunt   . Prostate cancer Maternal Grandfather   . Lung cancer Paternal Grandfather     Past Medical History:  Diagnosis Date  . Anxiety   . Depression   . Hypertension     Past Surgical History:  Procedure Laterality Date  . ROTATOR CUFF REPAIR Right   . tubal reversal      Current Outpatient Medications  Medication Sig Dispense Refill  . cloNIDine (CATAPRES) 0.1 MG tablet Take 0.1 mg by mouth at bedtime as needed (if SBP greater then 160).    . hydrochlorothiazide (HYDRODIURIL) 25 MG tablet Take 25 mg by mouth daily.    Marland Kitchen ibuprofen (ADVIL,MOTRIN) 200 MG tablet Take 800 mg by mouth every 6 (six) hours as needed for moderate pain.    Marland Kitchen linaclotide (LINZESS) 290 MCG CAPS capsule Take 290 mcg by mouth 2 (two) times daily.    . traZODone (DESYREL) 100 MG tablet Take 100 mg by mouth at bedtime as needed for sleep.     No current facility-administered  medications for this visit.     Allergies as of 07/12/2018  . (No Known Allergies)    Vitals: BP 117/69   Pulse 67   Ht 6\' 1"  (1.854 m)   Wt (!) 314 lb (142.4 kg)   LMP 07/06/2018   BMI 41.43 kg/m  Last  Weight:  Wt Readings from Last 1 Encounters:  07/12/18 (!) 314 lb (142.4 kg)   NGE:XBMW mass index is 41.43 kg/m.     Last Height:   Ht Readings from Last 1 Encounters:  07/12/18 6\' 1"  (1.854 m)    Physical exam:  General: The patient is awake, alert and appears not in acute distress. The patient is well groomed. Head: Normocephalic, atraumatic. Neck is supple. Mallampati 2-3 neck circumference:16". Nasal airflow patent , Retrognathia is not present - no braces or retainers, bruxism marks. All biological teeth.  Cardiovascular:  Regular rate and rhythm , without murmurs or carotid bruit, and without distended neck veins. Respiratory: Lungs are clear to auscultation. Skin:  Without evidence of edema, or rash Trunk: BMI is 41.4  . The patient's posture is erect. Neurologic exam : The patient is awake and alert, oriented to place and time.   Memory subjective  described as intact. Attention span & concentration ability appears normal.  Speech is fluent, without  dysarthria, dysphonia or aphasia.  Mood and affect are appropriate.  Cranial nerves: Pupils are equal and briskly reactive to light. Extraocular movements  in vertical and horizontal planes intact and without nystagmus. Visual fields by finger perimetry are intact.Hearing to finger rub intact. Facial sensation intact to fine touch. Facial motor strength is symmetric and tongue and uvula move midline. Shoulder shrug was symmetrical.   Motor exam:   Normal tone, muscle bulk and symmetric strength in all extremities. Sensory:  Fine touch, pinprick and vibration were tested in all extremities. Proprioception tested in the upper extremities was normal. Coordination: Rapid alternating movements -Finger-to-nose maneuver  normal without evidence of ataxia, dysmetria or tremor. Gait and station: Patient walks without assistive device and is able unassisted to climb up to the exam table. Strength within normal limits.  Stance is stable and normal.  Toe and heel stand were intact - Turns with  Steps. Romberg testing is  negative. Deep tendon reflexes: in the  upper and lower extremities are symmetric and intact.   Assessment:  After physical and neurologic examination, review of laboratory studies,  Personal review of imaging studies, reports of other /same  Imaging studies, results of polysomnography and / or neurophysiology testing and pre-existing records as far as provided in visit., my assessment is   1)  I will invite a mutual patient Estelene Carmack to undergo an attended sleep study. OSA risk is high, she can either undergo a SPLIT at 3 % with AHI 20 or just an at home screening for OSA by HST>     2)She has been not been dreaming for quite a while, she seems to be not just excessively daytime sleepy and fatigued but also somewhat sleep deprived.  We have discussed some sleep hygiene implementations, I would like for her to keep all electronics out of her bedroom and if she wakes up to just read in a book was pages.  I like for her to have a reading light and old-fashioned incandescent light bulb will do just fine, not LED,  not halogen.   No screen light to be emitted,  be turned around her alarm Will ring she does not need to look at the clock at night as it increases anxiety.  If daylight infiltrates her bedroom she may try a sleep mask or even earplugs if noises disturb her sleep.  I like for her to initiate sleep before midnight which means that she should have all electronics switched off 30 minutes prior.  It may help to have a hot bath or shower prior to going to bed as it is usually a relaxing start of the night.   Plan -I agree that the patient has high risk factors for obstructive sleep apnea and for this  reason she will either undergo a split-night polysomnography which allows me to diagnose apnea and treated or a home sleep test to screen for apnea. The patient was advised of the nature of the diagnosed disorder , the treatment options and the  risks for general health and wellness arising from not treating the condition.   I spent more than 45 minutes.   Greater than 50% of time was spent in counseling and coordination of care. We have discussed the diagnosis and differential and I answered the patient's questions.      Melvyn Novas, MD 07/12/2018, 3:18 PM  Certified in Neurology by ABPN Certified in Sleep Medicine by [facility] [address] [place], [place] [address]

## 2018-07-13 ENCOUNTER — Encounter: Payer: Self-pay | Admitting: Skilled Nursing Facility1

## 2018-07-13 ENCOUNTER — Encounter: Payer: BLUE CROSS/BLUE SHIELD | Attending: Surgery | Admitting: Skilled Nursing Facility1

## 2018-07-13 DIAGNOSIS — Z800 Family history of malignant neoplasm of digestive organs: Secondary | ICD-10-CM | POA: Insufficient documentation

## 2018-07-13 DIAGNOSIS — Z833 Family history of diabetes mellitus: Secondary | ICD-10-CM | POA: Insufficient documentation

## 2018-07-13 DIAGNOSIS — G4730 Sleep apnea, unspecified: Secondary | ICD-10-CM | POA: Diagnosis not present

## 2018-07-13 DIAGNOSIS — Z9889 Other specified postprocedural states: Secondary | ICD-10-CM | POA: Diagnosis not present

## 2018-07-13 DIAGNOSIS — I10 Essential (primary) hypertension: Secondary | ICD-10-CM | POA: Insufficient documentation

## 2018-07-13 DIAGNOSIS — E669 Obesity, unspecified: Secondary | ICD-10-CM

## 2018-07-13 DIAGNOSIS — Z86010 Personal history of colonic polyps: Secondary | ICD-10-CM | POA: Diagnosis not present

## 2018-07-13 DIAGNOSIS — M1990 Unspecified osteoarthritis, unspecified site: Secondary | ICD-10-CM | POA: Diagnosis not present

## 2018-07-13 DIAGNOSIS — Z713 Dietary counseling and surveillance: Secondary | ICD-10-CM | POA: Diagnosis not present

## 2018-07-13 NOTE — Progress Notes (Signed)
Pre-Op Assessment Visit:  Pre-Operative Sleeve Gastrectomy Surgery  Medical Nutrition Therapy:  Appt start time: 4:50  End time:  5:57  Patient was seen on 07/13/2018 for Pre-Operative Nutrition Assessment. Assessment and letter of approval faxed to [facility] Bariatric Surgery Program coordinator on 07/13/2018.   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 Pt states she got depressed when she was not able to work due to a shoulder surgery. Pt answered her phone during the appt.   Pt expectation of surgery: help to control my blood pressure and help reach weight gaol and have more energy and less shortness of breath   Pt expectation of Dietitian: none  Start weight at [facility]: 313.2 BMI: 41.32  24 hr Dietary Recall: First Meal: skipped or yogurt + granola bar  Snack:  Second Meal: skipped---chicken leg with green beans and corn Snack: cheese crackers Third Meal: salmon alfredo Snack:  Beverages: coffee with sugar and cream, water  Encouraged to engage in 150 minutes of moderate physical activity including cardiovascular and weight baring weekly  Handouts given during visit include:  . Pre-Op Goals . Bariatric Surgery Protein Shakes During the appointment today the following Pre-Op Goals were reviewed with the patient: . Maintain or lose weight as instructed by your surgeon . Make healthy food choices . Begin to limit portion sizes . Limited concentrated sugars and fried foods . Keep fat/sugar in the single digits per serving on             food labels . Practice CHEWING your food  (aim for 30 chews per bite or until applesauce consistency) . Practice not drinking 15 minutes before, during, and 30 minutes after each meal/snack . Avoid all carbonated beverages  . Avoid/limit caffeinated beverages  . Avoid all sugar-sweetened beverages . Consume 3 meals per day; eat every 3-5 hours . Make a list of non-food related activities . Aim for 64-100 ounces of FLUID daily  . Aim for at least  60-80 grams of PROTEIN daily . Look for a liquid protein source that contain ?15 g protein and ?5 g carbohydrate  (ex: shakes, drinks, shots)  -Follow diet recommendations listed below   Energy and Macronutrient Recomendations: Calories: 1600 Carbohydrate: 180 Protein: 120 Fat: 44  Demonstrated degree of understanding via:  Teach Back  Teaching Method Utilized:  Visual Auditory Hands on  Barriers to learning/adherence to lifestyle change: none identified   Patient to call the [facility] to enroll in Pre-Op and Post-Op Nutrition Education when surgery date is scheduled.

## 2018-07-14 ENCOUNTER — Institutional Professional Consult (permissible substitution): Payer: Self-pay | Admitting: Neurology

## 2018-07-17 ENCOUNTER — Ambulatory Visit: Payer: BLUE CROSS/BLUE SHIELD | Admitting: Skilled Nursing Facility1

## 2018-07-21 ENCOUNTER — Telehealth: Payer: Self-pay

## 2018-07-21 NOTE — Telephone Encounter (Signed)
BCBS denied in lab sleep study, need HST order 

## 2018-07-24 NOTE — Telephone Encounter (Signed)
HST is already ordered and active.

## 2018-07-31 ENCOUNTER — Encounter: Payer: BLUE CROSS/BLUE SHIELD | Attending: Surgery | Admitting: Skilled Nursing Facility1

## 2018-07-31 ENCOUNTER — Encounter: Payer: Self-pay | Admitting: Skilled Nursing Facility1

## 2018-07-31 DIAGNOSIS — Z800 Family history of malignant neoplasm of digestive organs: Secondary | ICD-10-CM | POA: Insufficient documentation

## 2018-07-31 DIAGNOSIS — Z9889 Other specified postprocedural states: Secondary | ICD-10-CM | POA: Insufficient documentation

## 2018-07-31 DIAGNOSIS — Z713 Dietary counseling and surveillance: Secondary | ICD-10-CM | POA: Diagnosis present

## 2018-07-31 DIAGNOSIS — Z833 Family history of diabetes mellitus: Secondary | ICD-10-CM | POA: Insufficient documentation

## 2018-07-31 DIAGNOSIS — G4730 Sleep apnea, unspecified: Secondary | ICD-10-CM | POA: Diagnosis not present

## 2018-07-31 DIAGNOSIS — Z86010 Personal history of colonic polyps: Secondary | ICD-10-CM | POA: Insufficient documentation

## 2018-07-31 DIAGNOSIS — M1990 Unspecified osteoarthritis, unspecified site: Secondary | ICD-10-CM | POA: Diagnosis not present

## 2018-07-31 DIAGNOSIS — I10 Essential (primary) hypertension: Secondary | ICD-10-CM | POA: Insufficient documentation

## 2018-07-31 DIAGNOSIS — E669 Obesity, unspecified: Secondary | ICD-10-CM

## 2018-07-31 NOTE — Progress Notes (Signed)
Pre-Operative Nutrition Class:  Appt start time: 7939   End time:  1830.  Patient was seen on 07/31/2018 for Pre-Operative Bariatric Surgery Education at [facility].   Surgery date:  Surgery type: sleeve Start weight at [facility]: 314 Weight today: 317.8  Samples given per MNT protocol. Patient educated on appropriate usage: Bariatric Advantage Multivitamin Lot #[id number] Exp: 10/20  Bariatric Advantage Calcium  Lot # [id number] Exp: January 29, 2019  [facility] Protein  Shake Lot # [id number] Exp: mar-05-2019  The following the learning objectives were met by the patient during this course:  Identify Pre-Op Dietary Goals and will begin 2 weeks pre-operatively  Identify appropriate sources of fluids and proteins   State protein recommendations and appropriate sources pre and post-operatively  Identify Post-Operative Dietary Goals and will follow for 2 weeks post-operatively  Identify appropriate multivitamin and calcium sources  Describe the need for physical activity post-operatively and will follow MD recommendations  State when to call healthcare provider regarding medication questions or post-operative complications  Handouts given during class include:  Pre-Op Bariatric Surgery Diet Handout  Protein Shake Handout  Post-Op Bariatric Surgery Nutrition Handout  [facility] Program Information Flyer  Support Group Information Flyer  [facility] Bariatric Supplements Price List  Follow[facility] Plan: [facility] per MD.

## 2018-08-27 ENCOUNTER — Emergency Department (HOSPITAL_COMMUNITY): Payer: BLUE CROSS/BLUE SHIELD

## 2018-08-27 ENCOUNTER — Emergency Department (HOSPITAL_COMMUNITY)
Admission: EM | Admit: 2018-08-27 | Discharge: 2018-08-27 | Disposition: A | Discharge status: Home / Self Care | Payer: BLUE CROSS/BLUE SHIELD | Attending: Emergency Medicine | Admitting: Emergency Medicine

## 2018-08-27 ENCOUNTER — Encounter (HOSPITAL_COMMUNITY): Payer: Self-pay | Admitting: *Deleted

## 2018-08-27 ENCOUNTER — Other Ambulatory Visit: Payer: Self-pay

## 2018-08-27 DIAGNOSIS — I10 Essential (primary) hypertension: Secondary | ICD-10-CM | POA: Diagnosis not present

## 2018-08-27 DIAGNOSIS — Z79899 Other long term (current) drug therapy: Secondary | ICD-10-CM | POA: Diagnosis not present

## 2018-08-27 DIAGNOSIS — M545 Low back pain, unspecified: Secondary | ICD-10-CM

## 2018-08-27 DIAGNOSIS — Z87891 Personal history of nicotine dependence: Secondary | ICD-10-CM | POA: Diagnosis not present

## 2018-08-27 MED ORDER — OXYCODONE HCL 5 MG PO TABS
5.0000 mg | ORAL_TABLET | Freq: Once | ORAL | Status: AC
  Administered 2018-08-27: 5 mg via ORAL
  Filled 2018-08-27: qty 1

## 2018-08-27 MED ORDER — KETOROLAC TROMETHAMINE 60 MG/2ML IM SOLN
15.0000 mg | Freq: Once | INTRAMUSCULAR | Status: AC
  Administered 2018-08-27: 15 mg via INTRAMUSCULAR
  Filled 2018-08-27: qty 2

## 2018-08-27 MED ORDER — ONDANSETRON 4 MG PO TBDP
4.0000 mg | ORAL_TABLET | Freq: Once | ORAL | Status: AC
  Administered 2018-08-27: 4 mg via ORAL
  Filled 2018-08-27: qty 1

## 2018-08-27 MED ORDER — ONDANSETRON HCL 4 MG PO TABS
4.0000 mg | ORAL_TABLET | Freq: Once | ORAL | Status: DC
  Filled 2018-08-27: qty 1

## 2018-08-27 MED ORDER — ACETAMINOPHEN 500 MG PO TABS
1000.0000 mg | ORAL_TABLET | Freq: Once | ORAL | Status: AC
  Administered 2018-08-27: 1000 mg via ORAL
  Filled 2018-08-27: qty 2

## 2018-08-27 MED ORDER — DIAZEPAM 5 MG PO TABS
5.0000 mg | ORAL_TABLET | Freq: Once | ORAL | Status: AC
  Administered 2018-08-27: 5 mg via ORAL
  Filled 2018-08-27: qty 1

## 2018-08-27 NOTE — ED Notes (Addendum)
Pt c/o lower back pain on R and L sides of her sacrum persisting for about 1 week.  Pt states muscle relaxants, heat, rest, and reposition help with pain. Aggravating movements make pain worse.   Pt unable to take ibuprofen as she is in prep for gastric sleeve surgery. Pt recently treated for UTI but culture was negative.

## 2018-08-27 NOTE — Discharge Instructions (Signed)
Your x-ray was unremarkable.  Please follow-up with your family doctor.  Use the Voltaren gel and Tylenol.  Please return to the emergency department for fever leg weakness or numbness having trouble urinating or having loss of control of your bowels.

## 2018-08-27 NOTE — ED Notes (Signed)
Bed: WA10 Expected date:  Expected time:  Means of arrival:  Comments: Triage 2 

## 2018-08-27 NOTE — ED Provider Notes (Signed)
[facility]-EMERGENCY DEPT Provider Note   CSN: [id number] Arrival date & time: 08/27/18  0226     History   Chief Complaint Chief Complaint  Patient presents with  . Back Pain    HPI Wendy George is a 47 y.o. female.  47 yo F with a chief complaint of left-sided low back pain.  Going on for the past week or so.  Has been trying Tylenol and muscle relaxant at home with minimal improvement.  Went to urgent care and there was a possible urinary tract infection and so she was started on Bactrim.  She had a call back when the culture came back negative.  She is continued to have pain.  Worse with palpation and movement twisting.  She denies trauma denies fever denies recent surgery.  Denies history of cancer.  Denies abdominal pain.  Has had some mild urinary frequency but denies hesitancy dysuria.  She denies loss of bowel or bladder denies loss of perirectal sensation.  Denies numbness or weakness to the legs.  The history is provided by the patient.  Back Pain   This is a new problem. The current episode started more than 1 week ago. The problem occurs constantly. The problem has not changed since onset.The pain is associated with no known injury. The pain is present in the lumbar spine. The quality of the pain is described as stabbing and shooting. The pain does not radiate. The pain is at a severity of 8/10. The pain is moderate. The symptoms are aggravated by bending, twisting and certain positions. Pertinent negatives include no chest pain, no fever, no headaches and no dysuria. She has tried muscle relaxants (tylenol) for the symptoms. The treatment provided mild relief. Risk factors include obesity and lack of exercise.    Past Medical History:  Diagnosis Date  . Anxiety   . Depression   . Hypertension     Patient Active Problem List   Diagnosis Date Noted  . Varicose veins of bilateral lower extremities with other complications 01/24/2017  . Spider  veins of both lower extremities 01/24/2017    Past Surgical History:  Procedure Laterality Date  . ROTATOR CUFF REPAIR Right   . tubal reversal       OB History   None      Home Medications    Prior to Admission medications   Medication Sig Start Date End Date Taking? Authorizing Provider  cloNIDine (CATAPRES) 0.1 MG tablet Take 0.1 mg by mouth at bedtime as needed (if SBP greater then 160).    [provider]  hydrochlorothiazide (HYDRODIURIL) 25 MG tablet Take 25 mg by mouth daily.    [provider]  ibuprofen (ADVIL,MOTRIN) 200 MG tablet Take 800 mg by mouth every 6 (six) hours as needed for moderate pain.    [provider]  linaclotide (LINZESS) 290 MCG CAPS capsule Take 290 mcg by mouth 2 (two) times daily.    [provider]  traZODone (DESYREL) 100 MG tablet Take 100 mg by mouth at bedtime as needed for sleep.    [provider]    Family History Family History  Problem Relation Age of Onset  . Diabetes Father   . Hypertension Father   . Colon cancer Maternal Aunt   . Colon cancer Paternal Aunt   . Diabetes Paternal Aunt   . Hypertension Paternal Aunt   . Prostate cancer Maternal Grandfather   . Lung cancer Paternal Grandfather     Social  History Social History   Tobacco Use  . Smoking status: Former Smoker    Types: Cigarettes    Last attempt to quit: 03/11/2018    Years since quitting: 0.4  . Smokeless tobacco: Never Used  . Tobacco comment: 1-2 cigarettes/day/ quit may 2019  Substance Use Topics  . Alcohol use: Yes    Comment: socially  . Drug use: No     Allergies   Patient has no known allergies.   Review of Systems Review of Systems  Constitutional: Negative for chills and fever.  HENT: Negative for congestion and rhinorrhea.   Eyes: Negative for redness and visual disturbance.  Respiratory: Negative for shortness of breath and wheezing.   Cardiovascular: Negative for chest pain and  palpitations.  Gastrointestinal: Negative for nausea and vomiting.  Genitourinary: Negative for dysuria and urgency.  Musculoskeletal: Positive for arthralgias, back pain and myalgias.  Skin: Negative for pallor and wound.  Neurological: Negative for dizziness and headaches.     Physical Exam Updated Vital Signs BP 116/62 (BP Location: Left Arm)   Pulse 60   Temp (!) 97.5 F (36.4 C) (Oral)   Resp 16   Ht 6\' 1"  (1.854 m)   Wt (!) 142.9 kg   LMP 08/18/2018 (Approximate)   SpO2 98%   BMI 41.56 kg/m   Physical Exam  Constitutional: She is oriented to person, place, and time. She appears well-developed and well-nourished. No distress.  HENT:  Head: Normocephalic and atraumatic.  Eyes: Pupils are equal, round, and reactive to light. EOM are normal.  Neck: Normal range of motion. Neck supple.  Cardiovascular: Normal rate and regular rhythm. Exam reveals no gallop and no friction rub.  No murmur heard. Pulmonary/Chest: Effort normal. She has no wheezes. She has no rales.  Abdominal: Soft. She exhibits no distension and no mass. There is no tenderness. There is no guarding.  Musculoskeletal: She exhibits tenderness. She exhibits no edema.  Mild tenderness along the L-spine worse about 2-4, paraspinal musculature tenderness on the left about the left SI joint.  No piriformis tenderness.  Pulse motor and sensation is intact distally.  Reflexes are 2+ and equal.  Ambulates without difficulty.  Neurological: She is alert and oriented to person, place, and time.  Skin: Skin is warm and dry. She is not diaphoretic.  Psychiatric: She has a normal mood and affect. Her behavior is normal.  Nursing note and vitals reviewed.    ED Treatments / Results  Labs (all labs ordered are listed, but only abnormal results are displayed) Labs Reviewed - No data to display  EKG None  Radiology Dg Lumbar Spine Complete  Result Date: 08/27/2018 CLINICAL DATA:  Acute onset of lower back pain.  EXAM: LUMBAR SPINE - COMPLETE 4+ VIEW COMPARISON:  Lumbar spine radiographs performed 07/03/2010, and CT of the abdomen and pelvis performed 05/28/2011 FINDINGS: There is no evidence of fracture or subluxation. Vertebral bodies demonstrate normal height and alignment. Intervertebral disc spaces are preserved. The visualized neural foramina are grossly unremarkable in appearance. The visualized bowel gas pattern is unremarkable in appearance; air and stool are noted within the colon. The sacroiliac joints are within normal limits. IMPRESSION: No evidence of fracture or subluxation along the lumbar spine. Electronically Signed   By: Roanna Raider M.D.   On: 08/27/2018 05:00    Procedures Procedures (including critical care time)  Medications Ordered in ED Medications  acetaminophen (TYLENOL) tablet 1,000 mg (1,000 mg Oral Given 08/27/18 0426)  ketorolac (TORADOL) injection 15 mg (  15 mg Intramuscular Given 08/27/18 0426)  oxyCODONE (Oxy IR/ROXICODONE) immediate release tablet 5 mg (5 mg Oral Given 08/27/18 0426)  diazepam (VALIUM) tablet 5 mg (5 mg Oral Given 08/27/18 0426)  ondansetron (ZOFRAN-ODT) disintegrating tablet 4 mg (4 mg Oral Given 08/27/18 0446)     Initial Impression / Assessment and Plan / ED Course  I have reviewed the triage vital signs and the nursing notes.  Pertinent labs & imaging results that were available during my care of the patient were reviewed by me and considered in my medical decision making (see chart for details).     47 yo F with muscular skeletal back pain by history and physical.  No red flags.  Patient is requesting a plain film because she feels that something must be wrong.  I discussed with her that something is wrong but she still insisted on applying a plain film.  We will treat her acute pain here.  Have her follow-up with her family doctor.  Plain film negative as viewed by me for fracture.  5:06 AM:  I have discussed the diagnosis/risks/treatment  options with the patient and family and believe the pt to be eligible for discharge home to follow-up with PCP. We also discussed returning to the ED immediately if new or worsening sx occur. We discussed the sx which are most concerning (e.g., sudden worsening pain, fever, inability to tolerate by mouth) that necessitate immediate return. Medications administered to the patient during their visit and any new prescriptions provided to the patient are listed below.  Medications given during this visit Medications  acetaminophen (TYLENOL) tablet 1,000 mg (1,000 mg Oral Given 08/27/18 0426)  ketorolac (TORADOL) injection 15 mg (15 mg Intramuscular Given 08/27/18 0426)  oxyCODONE (Oxy IR/ROXICODONE) immediate release tablet 5 mg (5 mg Oral Given 08/27/18 0426)  diazepam (VALIUM) tablet 5 mg (5 mg Oral Given 08/27/18 0426)  ondansetron (ZOFRAN-ODT) disintegrating tablet 4 mg (4 mg Oral Given 08/27/18 0446)      The patient appears reasonably screen and/or stabilized for discharge and I doubt any other medical condition or other [facility] requiring further screening, evaluation, or treatment in the ED at this time prior to discharge.    Final Clinical Impressions(s) / ED Diagnoses   Final diagnoses:  Acute left-sided low back pain without sciatica    ED Discharge Orders    None       Melene Plan, DO 08/27/18 1610

## 2018-08-27 NOTE — ED Triage Notes (Signed)
Pt c/o lower back pain x 1 week, seen @ [facility] for UTI, given abx but culture came back negative so she stopped the abx.  Pt denies trauma/injury.

## 2018-09-06 ENCOUNTER — Ambulatory Visit: Payer: Self-pay | Admitting: Surgery

## 2018-09-06 NOTE — Patient Instructions (Addendum)
Wendy George  09/06/2018   Your procedure is scheduled on: 09-18-18  Report to [facility] Main  Entrance  Report to admitting at 530 AM    Call this number if you have problems the morning of surgery [phone]   Remember: Do not eat food :After 600 pm night before surgery. BRUSH YOUR TEETH MORNING OF SURGERY AND RINSE YOUR MOUTH OUT, NO CHEWING GUM CANDY OR MINTS.  MORNING OF SURGERY DRINK:  1SHAKE BEFORE YOU LEAVE HOME, DRINK ALL OF THE SHAKE AT ONE TIME.   NO SOLID FOOD AFTER 600 PM THE NIGHT BEFORE YOUR SURGERY. YOU MAY DRINK CLEAR FLUIDS. THE SHAKE YOU DRINK BEFORE YOU LEAVE HOME WILL BE THE LAST FLUIDS YOU DRINK BEFORE SURGERY. DRINK PRE SURGERY SHAKE AT 415 AM.  PAIN IS EXPECTED AFTER SURGERY AND WILL NOT BE COMPLETELY ELIMINATED. AMBULATION AND TYLENOL WILL HELP REDUCE INCISIONAL AND GAS PAIN. MOVEMENT IS KEY!  YOU ARE EXPECTED TO BE OUT OF BED WITHIN 4 HOURS OF ADMISSION TO YOUR PATIENT ROOM.  SITTING IN THE RECLINER THROUGHOUT THE DAY IS IMPORTANT FOR DRINKING FLUIDS AND MOVING GAS THROUGHOUT THE GI TRACT.  COMPRESSION STOCKINGS SHOULD BE WORN [facility] STAY UNLESS YOU ARE WALKING.   INCENTIVE SPIROMETER SHOULD BE USED EVERY HOUR WHILE AWAKE TO DECREASE POST-OPERATIVE COMPLICATIONS SUCH AS PNEUMONIA.  WHEN DISCHARGED HOME, IT IS IMPORTANT TO CONTINUE TO WALK EVERY HOUR AND USE THE INCENTIVE SPIROMETER EVERY HOUR.      CLEAR LIQUID DIET   Foods Allowed                                                                     Foods Excluded  Coffee and tea, regular and decaf                             liquids that you cannot  Plain Jell-O in any flavor                                             see through such as: Fruit ices (not with fruit pulp)                                     milk, soups, orange juice  Iced Popsicles                                    All solid food Carbonated beverages, regular and diet                                     Cranberry, grape and apple juices Sports drinks like Gatorade Lightly seasoned clear broth or consume(fat free) Sugar, honey syrup  Sample Menu Breakfast  Lunch                                     Supper Cranberry juice                    Beef broth                            Chicken broth Jell-O                                     Grape juice                           Apple juice Coffee or tea                        Jell-O                                      Popsicle                                                Coffee or tea                        Coffee or tea  _____________________________________________________________________         Take these medicines the morning of surgery with A SIP OF WATER: CLONIDINE (CATAPRES) IF NEEDED             BRING CPAP MASK AND TUBING                               You may not have any metal on your body including hair pins and              piercings  Do not wear jewelry, make-up, lotions, powders or perfumes, deodorant             Do not wear nail polish.  Do not shave  48 hours prior to surgery.              Men may shave face and neck.   Do not bring valuables to the hospital. [facility] IS NOT             RESPONSIBLE   FOR VALUABLES.  Contacts, dentures or bridgework may not be worn into surgery.  Leave suitcase in the car. After surgery it may be brought to your room.                  Please read over the following fact sheets you were given: _____________________________________________________________________  [facility] Pre[facility] Before surgery, you can play an important role.  Because skin is not sterile, your skin needs to be as free of germs as possible.  You can reduce the number of germs on your skin by washing with CHG (chlorahexidine gluconate) soap before surgery.  CHG is an antiseptic cleaner which kills germs  and bonds with the skin to continue killing germs  even after washing. Please DO NOT use if you have an allergy to CHG or antibacterial soaps.  If your skin becomes reddened/irritated stop using the CHG and inform your nurse when you arrive at Short Stay. Do not shave (including legs and underarms) for at least 48 hours prior to the first CHG shower.  You may shave your face/neck. Please follow these instructions carefully:  1.  Shower with CHG Soap the night before surgery and the  morning of Surgery.  2.  If you choose to wash your hair, wash your hair first as usual with your  normal  shampoo.  3.  After you shampoo, rinse your hair and body thoroughly to remove the  shampoo.                           4.  Use CHG as you would any other liquid soap.  You can apply chg directly  to the skin and wash                       Gently with a scrungie or clean washcloth.  5.  Apply the CHG Soap to your body ONLY FROM THE NECK DOWN.   Do not use on face/ open                           Wound or open sores. Avoid contact with eyes, ears mouth and genitals (private parts).                       Wash face,  Genitals (private parts) with your normal soap.             6.  Wash thoroughly, paying special attention to the area where your surgery  will be performed.  7.  Thoroughly rinse your body with warm water from the neck down.  8.  DO NOT shower/wash with your normal soap after using and rinsing off  the CHG Soap.                9.  Pat yourself dry with a clean towel.            10.  Wear clean pajamas.            11.  Place clean sheets on your bed the night of your first shower and do not  sleep with pets. Day of Surgery : Do not apply any lotions/deodorants the morning of surgery.  Please wear clean clothes to the hospital/surgery center.  FAILURE TO FOLLOW THESE INSTRUCTIONS MAY RESULT IN THE CANCELLATION OF YOUR SURGERY PATIENT SIGNATURE_________________________________  NURSE  SIGNATURE__________________________________  ________________________________________________________________________   Adam Phenix  An incentive spirometer is a tool that can help keep your lungs clear and active. This tool measures how well you are filling your lungs with each breath. Taking long deep breaths may help reverse or decrease the chance of developing breathing (pulmonary) problems (especially infection) following:  A long period of time when you are unable to move or be active. BEFORE THE PROCEDURE   If the spirometer includes an indicator to show your best effort, your nurse or respiratory therapist will set it to a desired goal.  If possible, sit up straight or lean slightly forward. Try not to slouch.  Hold the incentive spirometer in an upright  position. INSTRUCTIONS FOR USE  1. Sit on the edge of your bed if possible, or sit up as far as you can in bed or on a chair. 2. Hold the incentive spirometer in an upright position. 3. Breathe out normally. 4. Place the mouthpiece in your mouth and seal your lips tightly around it. 5. Breathe in slowly and as deeply as possible, raising the piston or the ball toward the top of the column. 6. Hold your breath for 3-5 seconds or for as long as possible. Allow the piston or ball to fall to the bottom of the column. 7. Remove the mouthpiece from your mouth and breathe out normally. 8. Rest for a few seconds and repeat Steps 1 through 7 at least 10 times every 1-2 hours when you are awake. Take your time and take a few normal breaths between deep breaths. 9. The spirometer may include an indicator to show your best effort. Use the indicator as a goal to work toward during each repetition. 10. After each set of 10 deep breaths, practice coughing to be sure your lungs are clear. If you have an incision (the cut made at the time of surgery), support your incision when coughing by placing a pillow or rolled up towels firmly  against it. Once you are able to get out of bed, walk around indoors and cough well. You may stop using the incentive spirometer when instructed by your caregiver.  RISKS AND COMPLICATIONS  Take your time so you do not get dizzy or light-headed.  If you are in pain, you may need to take or ask for pain medication before doing incentive spirometry. It is harder to take a deep breath if you are having pain. AFTER USE  Rest and breathe slowly and easily.  It can be helpful to keep track of a log of your progress. Your caregiver can provide you with a simple table to help with this. If you are using the spirometer at home, follow these instructions: SEEK MEDICAL CARE IF:   You are having difficultly using the spirometer.  You have trouble using the spirometer as often as instructed.  Your pain medication is not giving enough relief while using the spirometer.  You develop fever of 100.5 F (38.1 C) or higher. SEEK IMMEDIATE MEDICAL CARE IF:   You cough up bloody sputum that had not been present before.  You develop fever of 102 F (38.9 C) or greater.  You develop worsening pain at or near the incision site. MAKE SURE YOU:   Understand these instructions.  Will watch your condition.  Will get help right away if you are not doing well or get worse. Document Released: 02/14/2007 Document Revised: 12/27/2011 Document Reviewed: 04/17/2007 [employer] Patient Information 2014 [employer], [place].   ________________________________________________________________________  WHAT IS A BLOOD TRANSFUSION? Blood Transfusion Information  A transfusion is the replacement of blood or some of its parts. Blood is made up of multiple cells which provide different functions.  Red blood cells carry oxygen and are used for blood loss replacement.  White blood cells fight against infection.  Platelets control bleeding.  Plasma helps clot blood.  Other blood products are available for  specialized needs, such as hemophilia or other clotting disorders. BEFORE THE TRANSFUSION  Who gives blood for transfusions?   Healthy volunteers who are fully evaluated to make sure their blood is safe. This is blood bank blood. Transfusion therapy is the safest it has ever been in the practice of medicine. Before  blood is taken from a donor, a complete history is taken to make sure that person has no history of diseases nor engages in risky social behavior (examples are intravenous drug use or sexual activity with multiple partners). The donor's travel history is screened to minimize risk of transmitting infections, such as malaria. The donated blood is tested for signs of infectious diseases, such as HIV and hepatitis. The blood is then tested to be sure it is compatible with you in order to minimize the chance of a transfusion reaction. If you or a relative donates blood, this is often done in anticipation of surgery and is not appropriate for emergency situations. It takes many days to process the donated blood. RISKS AND COMPLICATIONS Although transfusion therapy is very safe and saves many lives, the main dangers of transfusion include:   Getting an infectious disease.  Developing a transfusion reaction. This is an allergic reaction to something in the blood you were given. Every precaution is taken to prevent this. The decision to have a blood transfusion has been considered carefully by your caregiver before blood is given. Blood is not given unless the benefits outweigh the risks. AFTER THE TRANSFUSION  Right after receiving a blood transfusion, you will usually feel much better and more energetic. This is especially true if your red blood cells have gotten low (anemic). The transfusion raises the level of the red blood cells which carry oxygen, and this usually causes an energy increase.  The nurse administering the transfusion will monitor you carefully for complications. HOME CARE  INSTRUCTIONS  No special instructions are needed after a transfusion. You may find your energy is better. Speak with your caregiver about any limitations on activity for underlying diseases you may have. SEEK MEDICAL CARE IF:   Your condition is not improving after your transfusion.  You develop redness or irritation at the intravenous (IV) site. SEEK IMMEDIATE MEDICAL CARE IF:  Any of the following symptoms occur over the next 12 hours:  Shaking chills.  You have a temperature by mouth above 102 F (38.9 C), not controlled by medicine.  Chest, back, or muscle pain.  People around you feel you are not acting correctly or are confused.  Shortness of breath or difficulty breathing.  Dizziness and fainting.  You get a rash or develop hives.  You have a decrease in urine output.  Your urine turns a dark color or changes to pink, red, or brown. Any of the following symptoms occur over the next 10 days:  You have a temperature by mouth above 102 F (38.9 C), not controlled by medicine.  Shortness of breath.  Weakness after normal activity.  The white part of the eye turns yellow (jaundice).  You have a decrease in the amount of urine or are urinating less often.  Your urine turns a dark color or changes to pink, red, or brown. Document Released: 10/01/2000 Document Revised: 12/27/2011 Document Reviewed: 05/20/2008 [facility] Patient Information 2014 [place], [place].  _______________________________________________________________________

## 2018-09-06 NOTE — Progress Notes (Addendum)
EKG AND CHEST XRAY DONE 07-10-18 EPIC

## 2018-09-07 ENCOUNTER — Other Ambulatory Visit: Payer: Self-pay

## 2018-09-07 ENCOUNTER — Encounter (HOSPITAL_COMMUNITY): Payer: Self-pay

## 2018-09-07 ENCOUNTER — Encounter (HOSPITAL_COMMUNITY)
Admission: RE | Admit: 2018-09-07 | Discharge: 2018-09-07 | Disposition: A | Discharge status: Home / Self Care | Payer: BLUE CROSS/BLUE SHIELD | Source: Ambulatory Visit | Attending: Surgery | Admitting: Surgery

## 2018-09-07 DIAGNOSIS — I10 Essential (primary) hypertension: Secondary | ICD-10-CM | POA: Diagnosis not present

## 2018-09-07 DIAGNOSIS — Z01812 Encounter for preprocedural laboratory examination: Secondary | ICD-10-CM | POA: Diagnosis not present

## 2018-09-07 HISTORY — DX: Low back pain: M54.5

## 2018-09-07 HISTORY — DX: Low back pain, unspecified: M54.50

## 2018-09-07 HISTORY — DX: Sleep apnea, unspecified: G47.30

## 2018-09-07 HISTORY — DX: Urgency of urination: R39.15

## 2018-09-07 HISTORY — DX: Headache: R51

## 2018-09-07 HISTORY — DX: Other chronic pain: G89.29

## 2018-09-07 HISTORY — DX: Headache, unspecified: R51.9

## 2018-09-07 HISTORY — DX: Pain in right shoulder: M25.511

## 2018-09-07 LAB — COMPREHENSIVE METABOLIC PANEL
ALBUMIN: 4.2 g/dL (ref 3.5–5.0)
ALK PHOS: 49 U/L (ref 38–126)
ALT: 23 U/L (ref 0–44)
AST: 16 U/L (ref 15–41)
Anion gap: 7 (ref 5–15)
BUN: 9 mg/dL (ref 6–20)
CALCIUM: 9.1 mg/dL (ref 8.9–10.3)
CO2: 28 mmol/L (ref 22–32)
Chloride: 104 mmol/L (ref 98–111)
Creatinine, Ser: 0.78 mg/dL (ref 0.44–1.00)
GFR calc Af Amer: >60 mL/min (ref >60)
GFR calc non Af Amer: >60 mL/min (ref >60)
GLUCOSE: 108 mg/dL — AB (ref 70–99)
Potassium: 3.9 mmol/L (ref 3.5–5.1)
SODIUM: 139 mmol/L (ref 135–145)
Total Bilirubin: 0.9 mg/dL (ref 0.3–1.2)
Total Protein: 7.1 g/dL (ref 6.5–8.1)

## 2018-09-07 LAB — CBC WITH DIFFERENTIAL/PLATELET
ABS IMMATURE GRANULOCYTES: 0.01 10*3/uL (ref 0.00–0.07)
Basophils Absolute: 0 10*3/uL (ref 0.0–0.1)
Basophils Relative: 0 %
Eosinophils Absolute: 0 10*3/uL (ref 0.0–0.5)
Eosinophils Relative: 1 %
HCT: 40.2 % (ref 36.0–46.0)
HEMOGLOBIN: 13 g/dL (ref 12.0–15.0)
Immature Granulocytes: 0 %
LYMPHS ABS: 1.7 10*3/uL (ref 0.7–4.0)
Lymphocytes Relative: 27 %
MCH: 28.3 pg (ref 26.0–34.0)
MCHC: 32.3 g/dL (ref 30.0–36.0)
MCV: 87.6 fL (ref 80.0–100.0)
MONO ABS: 0.5 10*3/uL (ref 0.1–1.0)
Monocytes Relative: 8 %
Neutro Abs: 4 10*3/uL (ref 1.7–7.7)
Neutrophils Relative %: 64 %
Platelets: 213 10*3/uL (ref 150–400)
RBC: 4.59 MIL/uL (ref 3.87–5.11)
RDW: 13 % (ref 11.5–15.5)
WBC: 6.3 10*3/uL (ref 4.0–10.5)
nRBC: 0 % (ref 0.0–0.2)

## 2018-09-07 LAB — TYPE AND SCREEN
ABO/RH(D): B POS
Antibody Screen: NEGATIVE

## 2018-09-07 LAB — ABO/RH: ABO/RH(D): B POS

## 2018-09-07 NOTE — H&P (View-Only) (Signed)
Surgical H&P  CC: morbid obesity  HPI: she returns today for her preop visit.  She has completed the preoperative pathway with no barriers identified. She had her preop visit today.  She has some insightful questions discussed. UGI/ CXR 07/10/18- negative. No hiatal hernia.  Nutritionist- approved, still has behavioral issues that she will need to continue to work on [employer], Dr. Cyndia Skeeters [facility]- vitamin D was low,hemoglobin A1c 5.7, otherwise unremarkable Sleep study- was positive for sleep apnea, recommendations for CPAP titration  Initial Visit 06/22/18: Very nice 47 year old woman who presents to discuss weight loss surgery. She's been struggling with her weight for her entire life. She has tried numerous diets as well as medications and will lose a few pounds but ultimately regained and plus more. Associated comorbidities include hypertension and multiple joint pains/ arthritis. She also describes waking up feeling like she is not breathing at night. She has never been evaluated for sleep apnea. She has done a fair amount of research and is interested in the sleeve gastrectomy. She denies reflux. Has not been diagnosed with diabetes but states that her numbers have been a little high. Prior surgeries include tubal ligation followed by reversal via Pfannenstiel. Has also had right shoulder surgery? Family history is notable for colon cancer as well as diabetes. She had a colonoscopy last year and had multiple polyps and apparently has to get another one this year. She does not smoke, very occasional alcohol use, no drugs. 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 She has 4 children the youngest is 27 and the oldest is in their upper 67s, to her children live with her and the other to live in [place] and [place].  No Known Allergies  Past Medical History:  Diagnosis Date  . Anxiety   . Chronic lower back pain   . Chronic pain in right  shoulder   . Depression   . Headache    HX MIGRAINES YRS AGO  . Hypertension   . Sleep apnea    CPAP WITH AUTO SET  . Urinary urgency     Past Surgical History:  Procedure Laterality Date  . ROTATOR CUFF REPAIR Right 11/10/2015  . TUBAL LIGATION    . tubal reversal      Family History  Problem Relation Age of Onset  . Diabetes Father   . Hypertension Father   . Colon cancer Maternal Aunt   . Colon cancer Paternal Aunt   . Diabetes Paternal Aunt   . Hypertension Paternal Aunt   . Prostate cancer Maternal Grandfather   . Lung cancer Paternal Grandfather     Social History   Socioeconomic History  . Marital status: Legally Separated    Spouse name: Not on file  . Number of children: Not on file  . Years of education: Not on file  . Highest education level: Not on file  Occupational History  . Not on file  Social Needs  . Financial resource strain: Not on file  . Food insecurity:    Worry: Not on file    Inability: Not on file  . Transportation needs:    Medical: Not on file    Non-medical: Not on file  Tobacco Use  . Smoking status: Former Smoker    Packs/day: 0.25    Years: 25.00    Pack years: 6.25    Types: Cigarettes    Last attempt to quit: 03/11/2018    Years since quitting: 0.4  .  Smokeless tobacco: Never Used  . Tobacco comment: 1-2 cigarettes/day/ quit may 2019  Substance and Sexual Activity  . Alcohol use: Yes    Comment: socially  . Drug use: No  . Sexual activity: Yes    Birth control/protection: Condom  Lifestyle  . Physical activity:    Days per week: Not on file    Minutes per session: Not on file  . Stress: Not on file  Relationships  . Social connections:    Talks on phone: Not on file    Gets together: Not on file    Attends religious service: Not on file    Active member of club or organization: Not on file    Attends meetings of clubs or organizations: Not on file    Relationship status: Not on file  Other Topics Concern  .  Not on file  Social History Narrative  . Not on file    Current Outpatient Medications on File Prior to Visit  Medication Sig Dispense Refill  . cloNIDine (CATAPRES) 0.1 MG tablet Take 0.1 mg by mouth as needed (if SBP greater then 160/90).     . hydrochlorothiazide (HYDRODIURIL) 25 MG tablet Take 25 mg by mouth daily.     No current facility-administered medications on file prior to visit.     Review of Systems: a complete, 10pt review of systems was completed with pertinent positives and negatives as documented in the HPI  Physical Exam: There were no vitals filed for this visit. Gen: A&Ox3, no distress  Head: normocephalic, atraumatic Eyes: extraocular motions intact, anicteric.  Neck: supple without mass or thyromegaly Chest: unlabored respirations, symmetrical air entry, clear bilaterally   Cardiovascular: RRR with palpable distal pulses, no pedal edema Abdomen: soft, nondistended, nontender. No mass or organomegaly.  Extremities: warm, without edema, no deformities  Neuro: grossly intact Psych: appropriate mood and affect, normal insight  Skin: warm and dry   CBC Latest Ref Rng & Units 09/07/2018 06/28/2018  WBC 4.0 - 10.5 K/uL 6.3 6.1  Hemoglobin 12.0 - 15.0 g/dL 09.813.0 11.913.8  Hematocrit 14.736.0 - 46.0 % 40.2 40.7  Platelets 150 - 400 K/uL 213 282    CMP Latest Ref Rng & Units 09/07/2018 06/28/2018  Glucose 70 - 99 mg/dL 829(F108(H) 99  BUN 6 - 20 mg/dL 9 11  Creatinine 6.210.44 - 1.00 mg/dL 3.080.78 6.570.88  Sodium 846135 - 145 mmol/L 139 143  Potassium 3.5 - 5.1 mmol/L 3.9 3.7  Chloride 98 - 111 mmol/L 104 107  CO2 22 - 32 mmol/L 28 27  Calcium 8.9 - 10.3 mg/dL 9.1 9.3  Total Protein 6.5 - 8.1 g/dL 7.1 6.8  Total Bilirubin 0.3 - 1.2 mg/dL 0.9 0.4  Alkaline Phos 38 - 126 U/L 49 49  AST 15 - 41 U/L 16 18  ALT 0 - 44 U/L 23 15    No results found for: INR, PROTIME  Imaging: No results found.   A/P: Morbid obesity. She remains an appropriate candidate for sleeve gastrectomy. We  discussed the surgery including technical aspects, the risks of bleeding, infection, pain, scarring, injury to intra-abdominal structures, staple line leak or abscess, chronic abdominal pain or nausea, new onset or worsened GERD, DVT/PE, pneumonia, heart attack, stroke, death, failure to reach weight loss goals and weight regain, hernia. Discussed the typical pre-, peri-, and postoperative course. Discussed the importance of lifelong behavioral changes to combat the chronic and relapsing disease which is obesity. Questions were answered. She is ready to proceed in a couple  weeks.   Phylliss Blakes, MD [facility], [place] Pager [phone]

## 2018-09-07 NOTE — H&P (Signed)
Surgical H&P  CC: morbid obesity  HPI: she returns today for her preop visit.  She has completed the preoperative pathway with no barriers identified. She had her preop visit today.  She has some insightful questions discussed. UGI/ CXR 07/10/18- negative. No hiatal hernia.  Nutritionist- approved, still has behavioral issues that she will need to continue to work on [employer], Dr. Cyndia Skeeters [facility]- vitamin D was low,hemoglobin A1c 5.7, otherwise unremarkable Sleep study- was positive for sleep apnea, recommendations for CPAP titration  Initial Visit 06/22/18: Very nice 47 year old woman who presents to discuss weight loss surgery. She's been struggling with her weight for her entire life. She has tried numerous diets as well as medications and will lose a few pounds but ultimately regained and plus more. Associated comorbidities include hypertension and multiple joint pains/ arthritis. She also describes waking up feeling like she is not breathing at night. She has never been evaluated for sleep apnea. She has done a fair amount of research and is interested in the sleeve gastrectomy. She denies reflux. Has not been diagnosed with diabetes but states that her numbers have been a little high. Prior surgeries include tubal ligation followed by reversal via Pfannenstiel. Has also had right shoulder surgery? Family history is notable for colon cancer as well as diabetes. She had a colonoscopy last year and had multiple polyps and apparently has to get another one this year. She does not smoke, very occasional alcohol use, no drugs. 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 She has 4 children the youngest is 70 and the oldest is in their upper 81s, to her children live with her and the other to live in [place] and [place].  No Known Allergies  Past Medical History:  Diagnosis Date  . Anxiety   . Chronic lower back pain   . Chronic pain in right  shoulder   . Depression   . Headache    HX MIGRAINES YRS AGO  . Hypertension   . Sleep apnea    CPAP WITH AUTO SET  . Urinary urgency     Past Surgical History:  Procedure Laterality Date  . ROTATOR CUFF REPAIR Right 11/10/2015  . TUBAL LIGATION    . tubal reversal      Family History  Problem Relation Age of Onset  . Diabetes Father   . Hypertension Father   . Colon cancer Maternal Aunt   . Colon cancer Paternal Aunt   . Diabetes Paternal Aunt   . Hypertension Paternal Aunt   . Prostate cancer Maternal Grandfather   . Lung cancer Paternal Grandfather     Social History   Socioeconomic History  . Marital status: Legally Separated    Spouse name: Not on file  . Number of children: Not on file  . Years of education: Not on file  . Highest education level: Not on file  Occupational History  . Not on file  Social Needs  . Financial resource strain: Not on file  . Food insecurity:    Worry: Not on file    Inability: Not on file  . Transportation needs:    Medical: Not on file    Non-medical: Not on file  Tobacco Use  . Smoking status: Former Smoker    Packs/day: 0.25    Years: 25.00    Pack years: 6.25    Types: Cigarettes    Last attempt to quit: 03/11/2018    Years since quitting: 0.4  .  Smokeless tobacco: Never Used  . Tobacco comment: 1-2 cigarettes/day/ quit may 2019  Substance and Sexual Activity  . Alcohol use: Yes    Comment: socially  . Drug use: No  . Sexual activity: Yes    Birth control/protection: Condom  Lifestyle  . Physical activity:    Days per week: Not on file    Minutes per session: Not on file  . Stress: Not on file  Relationships  . Social connections:    Talks on phone: Not on file    Gets together: Not on file    Attends religious service: Not on file    Active member of club or organization: Not on file    Attends meetings of clubs or organizations: Not on file    Relationship status: Not on file  Other Topics Concern  .  Not on file  Social History Narrative  . Not on file    Current Outpatient Medications on File Prior to Visit  Medication Sig Dispense Refill  . cloNIDine (CATAPRES) 0.1 MG tablet Take 0.1 mg by mouth as needed (if SBP greater then 160/90).     . hydrochlorothiazide (HYDRODIURIL) 25 MG tablet Take 25 mg by mouth daily.     No current facility-administered medications on file prior to visit.     Review of Systems: a complete, 10pt review of systems was completed with pertinent positives and negatives as documented in the HPI  Physical Exam: There were no vitals filed for this visit. Gen: A&Ox3, no distress  Head: normocephalic, atraumatic Eyes: extraocular motions intact, anicteric.  Neck: supple without mass or thyromegaly Chest: unlabored respirations, symmetrical air entry, clear bilaterally   Cardiovascular: RRR with palpable distal pulses, no pedal edema Abdomen: soft, nondistended, nontender. No mass or organomegaly.  Extremities: warm, without edema, no deformities  Neuro: grossly intact Psych: appropriate mood and affect, normal insight  Skin: warm and dry   CBC Latest Ref Rng & Units 09/07/2018 06/28/2018  WBC 4.0 - 10.5 K/uL 6.3 6.1  Hemoglobin 12.0 - 15.0 g/dL 09.813.0 11.913.8  Hematocrit 14.736.0 - 46.0 % 40.2 40.7  Platelets 150 - 400 K/uL 213 282    CMP Latest Ref Rng & Units 09/07/2018 06/28/2018  Glucose 70 - 99 mg/dL 829(F108(H) 99  BUN 6 - 20 mg/dL 9 11  Creatinine 6.210.44 - 1.00 mg/dL 3.080.78 6.570.88  Sodium 846135 - 145 mmol/L 139 143  Potassium 3.5 - 5.1 mmol/L 3.9 3.7  Chloride 98 - 111 mmol/L 104 107  CO2 22 - 32 mmol/L 28 27  Calcium 8.9 - 10.3 mg/dL 9.1 9.3  Total Protein 6.5 - 8.1 g/dL 7.1 6.8  Total Bilirubin 0.3 - 1.2 mg/dL 0.9 0.4  Alkaline Phos 38 - 126 U/L 49 49  AST 15 - 41 U/L 16 18  ALT 0 - 44 U/L 23 15    No results found for: INR, PROTIME  Imaging: No results found.   A/P: Morbid obesity. She remains an appropriate candidate for sleeve gastrectomy. We  discussed the surgery including technical aspects, the risks of bleeding, infection, pain, scarring, injury to intra-abdominal structures, staple line leak or abscess, chronic abdominal pain or nausea, new onset or worsened GERD, DVT/PE, pneumonia, heart attack, stroke, death, failure to reach weight loss goals and weight regain, hernia. Discussed the typical pre-, peri-, and postoperative course. Discussed the importance of lifelong behavioral changes to combat the chronic and relapsing disease which is obesity. Questions were answered. She is ready to proceed in a couple  weeks.   Phylliss Blakes, MD [facility], [place] Pager [phone]

## 2018-09-17 MED ORDER — BUPIVACAINE LIPOSOME 1.3 % IJ SUSP
20.0000 mL | INTRAMUSCULAR | Status: DC
  Filled 2018-09-17: qty 20

## 2018-09-17 NOTE — Anesthesia Preprocedure Evaluation (Addendum)
Anesthesia Evaluation  Patient identified by MRN, date of birth, ID band Patient awake    Reviewed: Allergy & Precautions, NPO status , Patient's Chart, lab work & pertinent test results  History of Anesthesia Complications Negative for: history of anesthetic complications  Airway Mallampati: II  TM Distance: >3 FB Neck ROM: Full    Dental no notable dental hx. (+) Dental Advisory Given   Pulmonary sleep apnea and Continuous Positive Airway Pressure Ventilation , former smoker,    Pulmonary exam normal        Cardiovascular hypertension, Pt. on medications Normal cardiovascular exam     Neuro/Psych PSYCHIATRIC DISORDERS Anxiety Depression negative neurological ROS     GI/Hepatic negative GI ROS, Neg liver ROS,   Endo/Other  Morbid obesity  Renal/GU negative Renal ROS     Musculoskeletal negative musculoskeletal ROS (+)   Abdominal   Peds  Hematology negative hematology ROS (+)   Anesthesia Other Findings Day of surgery medications reviewed with the patient.  Reproductive/Obstetrics                            Anesthesia Physical Anesthesia Plan  ASA: III  Anesthesia Plan: General   Post-op Pain Management:    Induction: Intravenous  PONV Risk Score and Plan: 4 or greater and Dexamethasone, Ondansetron, Scopolamine patch - Pre-op and Diphenhydramine  Airway Management Planned: Oral ETT  Additional Equipment:   Intra-op Plan:   Post-operative Plan: Extubation in OR  Informed Consent: I have reviewed the patients History and Physical, chart, labs and discussed the procedure including the risks, benefits and alternatives for the proposed anesthesia with the patient or authorized representative who has indicated his/her understanding and acceptance.   Dental advisory given  Plan Discussed with: CRNA and Anesthesiologist  Anesthesia Plan Comments:        Anesthesia Quick  Evaluation

## 2018-09-18 ENCOUNTER — Inpatient Hospital Stay (HOSPITAL_COMMUNITY)
Admission: RE | Admit: 2018-09-18 | Discharge: 2018-09-20 | DRG: 621 | Disposition: A | Discharge status: Home / Self Care | Payer: BLUE CROSS/BLUE SHIELD | Source: Ambulatory Visit | Attending: Surgery | Admitting: Surgery

## 2018-09-18 ENCOUNTER — Inpatient Hospital Stay (HOSPITAL_COMMUNITY): Payer: BLUE CROSS/BLUE SHIELD | Admitting: Anesthesiology

## 2018-09-18 ENCOUNTER — Encounter (HOSPITAL_COMMUNITY)
Admission: RE | Disposition: A | Discharge status: Home / Self Care | Payer: Self-pay | Source: Ambulatory Visit | Attending: Surgery

## 2018-09-18 ENCOUNTER — Other Ambulatory Visit: Payer: Self-pay

## 2018-09-18 ENCOUNTER — Encounter (HOSPITAL_COMMUNITY): Payer: Self-pay | Admitting: *Deleted

## 2018-09-18 DIAGNOSIS — Z79899 Other long term (current) drug therapy: Secondary | ICD-10-CM | POA: Diagnosis not present

## 2018-09-18 DIAGNOSIS — F419 Anxiety disorder, unspecified: Secondary | ICD-10-CM | POA: Diagnosis present

## 2018-09-18 DIAGNOSIS — F329 Major depressive disorder, single episode, unspecified: Secondary | ICD-10-CM | POA: Diagnosis present

## 2018-09-18 DIAGNOSIS — G4733 Obstructive sleep apnea (adult) (pediatric): Secondary | ICD-10-CM | POA: Diagnosis present

## 2018-09-18 DIAGNOSIS — Z86010 Personal history of colonic polyps: Secondary | ICD-10-CM | POA: Diagnosis not present

## 2018-09-18 DIAGNOSIS — R110 Nausea: Secondary | ICD-10-CM | POA: Diagnosis not present

## 2018-09-18 DIAGNOSIS — I10 Essential (primary) hypertension: Secondary | ICD-10-CM | POA: Diagnosis present

## 2018-09-18 DIAGNOSIS — Z6841 Body Mass Index (BMI) 40.0 and over, adult: Secondary | ICD-10-CM

## 2018-09-18 DIAGNOSIS — Z9989 Dependence on other enabling machines and devices: Secondary | ICD-10-CM

## 2018-09-18 HISTORY — PX: LAPAROSCOPIC GASTRIC SLEEVE RESECTION: SHX5895

## 2018-09-18 LAB — PREGNANCY, URINE: Preg Test, Ur: NEGATIVE

## 2018-09-18 SURGERY — GASTRECTOMY, SLEEVE, LAPAROSCOPIC
Anesthesia: General

## 2018-09-18 MED ORDER — BUPIVACAINE LIPOSOME 1.3 % IJ SUSP
INTRAMUSCULAR | Status: DC | PRN
  Administered 2018-09-18: 20 mL

## 2018-09-18 MED ORDER — LACTATED RINGERS IV SOLN
INTRAVENOUS | Status: DC
  Administered 2018-09-18 (×2): via INTRAVENOUS

## 2018-09-18 MED ORDER — LACTATED RINGERS IR SOLN
Status: DC | PRN
  Administered 2018-09-18: 1

## 2018-09-18 MED ORDER — FENTANYL CITRATE (PF) 100 MCG/2ML IJ SOLN
INTRAMUSCULAR | Status: AC
  Filled 2018-09-18: qty 2

## 2018-09-18 MED ORDER — SCOPOLAMINE 1 MG/3DAYS TD PT72
1.0000 | MEDICATED_PATCH | TRANSDERMAL | Status: DC
  Administered 2018-09-18: 1.5 mg via TRANSDERMAL
  Filled 2018-09-18: qty 1

## 2018-09-18 MED ORDER — GABAPENTIN 100 MG PO CAPS
200.0000 mg | ORAL_CAPSULE | Freq: Two times a day (BID) | ORAL | Status: DC
  Administered 2018-09-18: 200 mg via ORAL
  Filled 2018-09-18 (×3): qty 2

## 2018-09-18 MED ORDER — SIMETHICONE 80 MG PO CHEW
80.0000 mg | CHEWABLE_TABLET | Freq: Four times a day (QID) | ORAL | Status: DC | PRN
  Administered 2018-09-18: 80 mg via ORAL
  Filled 2018-09-18: qty 1

## 2018-09-18 MED ORDER — ENOXAPARIN SODIUM 30 MG/0.3ML ~~LOC~~ SOLN
30.0000 mg | Freq: Two times a day (BID) | SUBCUTANEOUS | Status: DC
  Administered 2018-09-18 – 2018-09-19 (×3): 30 mg via SUBCUTANEOUS
  Filled 2018-09-18 (×3): qty 0.3

## 2018-09-18 MED ORDER — SUGAMMADEX SODIUM 500 MG/5ML IV SOLN
INTRAVENOUS | Status: DC | PRN
  Administered 2018-09-18: 400 mg via INTRAVENOUS

## 2018-09-18 MED ORDER — FENTANYL CITRATE (PF) 100 MCG/2ML IJ SOLN
25.0000 ug | INTRAMUSCULAR | Status: DC | PRN
  Administered 2018-09-18 (×3): 50 ug via INTRAVENOUS

## 2018-09-18 MED ORDER — BUPIVACAINE-EPINEPHRINE (PF) 0.25% -1:200000 IJ SOLN
INTRAMUSCULAR | Status: AC
  Filled 2018-09-18: qty 30

## 2018-09-18 MED ORDER — MIDAZOLAM HCL 2 MG/2ML IJ SOLN
INTRAMUSCULAR | Status: AC
  Filled 2018-09-18: qty 2

## 2018-09-18 MED ORDER — BUPIVACAINE-EPINEPHRINE 0.25% -1:200000 IJ SOLN
INTRAMUSCULAR | Status: DC | PRN
  Administered 2018-09-18: 30 mL

## 2018-09-18 MED ORDER — KETAMINE HCL 10 MG/ML IJ SOLN
INTRAMUSCULAR | Status: DC | PRN
  Administered 2018-09-18: 20 mg via INTRAVENOUS
  Administered 2018-09-18: 30 mg via INTRAVENOUS

## 2018-09-18 MED ORDER — ACETAMINOPHEN 500 MG PO TABS
1000.0000 mg | ORAL_TABLET | ORAL | Status: AC
  Administered 2018-09-18: 1000 mg via ORAL
  Filled 2018-09-18: qty 2

## 2018-09-18 MED ORDER — DIPHENHYDRAMINE HCL 50 MG/ML IJ SOLN
INTRAMUSCULAR | Status: DC | PRN
  Administered 2018-09-18: 12.5 mg via INTRAVENOUS

## 2018-09-18 MED ORDER — DOCUSATE SODIUM 100 MG PO CAPS
100.0000 mg | ORAL_CAPSULE | Freq: Two times a day (BID) | ORAL | Status: DC
  Administered 2018-09-18 – 2018-09-19 (×3): 100 mg via ORAL
  Filled 2018-09-18 (×3): qty 1

## 2018-09-18 MED ORDER — PROPOFOL 10 MG/ML IV BOLUS
INTRAVENOUS | Status: AC
  Filled 2018-09-18: qty 40

## 2018-09-18 MED ORDER — ONDANSETRON HCL 4 MG/2ML IJ SOLN
INTRAMUSCULAR | Status: DC | PRN
  Administered 2018-09-18: 4 mg via INTRAVENOUS

## 2018-09-18 MED ORDER — PROMETHAZINE HCL 25 MG/ML IJ SOLN: 6.2500 mg | INTRAMUSCULAR | Status: DC | PRN

## 2018-09-18 MED ORDER — ACETAMINOPHEN 160 MG/5ML PO SOLN
650.0000 mg | Freq: Four times a day (QID) | ORAL | Status: DC
  Administered 2018-09-18 – 2018-09-19 (×3): 650 mg via ORAL
  Filled 2018-09-18 (×3): qty 20.3

## 2018-09-18 MED ORDER — TRAZODONE HCL 50 MG PO TABS: 50.0000 mg | ORAL_TABLET | Freq: Every evening | ORAL | Status: DC | PRN

## 2018-09-18 MED ORDER — DEXAMETHASONE SODIUM PHOSPHATE 4 MG/ML IJ SOLN
4.0000 mg | INTRAMUSCULAR | Status: AC
  Administered 2018-09-18: 4 mg via INTRAVENOUS

## 2018-09-18 MED ORDER — OXYCODONE HCL 5 MG/5ML PO SOLN: 5.0000 mg | ORAL | Status: DC | PRN

## 2018-09-18 MED ORDER — HYDROMORPHONE HCL 1 MG/ML IJ SOLN: 0.5000 mg | INTRAMUSCULAR | Status: DC | PRN

## 2018-09-18 MED ORDER — METHOCARBAMOL 500 MG IVPB - SIMPLE MED
500.0000 mg | Freq: Four times a day (QID) | INTRAVENOUS | Status: DC | PRN
  Filled 2018-09-18: qty 50

## 2018-09-18 MED ORDER — PREMIER PROTEIN SHAKE
2.0000 [oz_av] | ORAL | Status: DC
  Administered 2018-09-20: 1 [oz_av] via ORAL

## 2018-09-18 MED ORDER — SUGAMMADEX SODIUM 500 MG/5ML IV SOLN
INTRAVENOUS | Status: AC
  Filled 2018-09-18: qty 5

## 2018-09-18 MED ORDER — PANTOPRAZOLE SODIUM 40 MG IV SOLR
40.0000 mg | Freq: Every day | INTRAVENOUS | Status: DC
  Administered 2018-09-18 – 2018-09-19 (×2): 40 mg via INTRAVENOUS
  Filled 2018-09-18 (×2): qty 40

## 2018-09-18 MED ORDER — METOPROLOL TARTRATE 5 MG/5ML IV SOLN: 5.0000 mg | Freq: Four times a day (QID) | INTRAVENOUS | Status: DC | PRN

## 2018-09-18 MED ORDER — ROCURONIUM BROMIDE 10 MG/ML (PF) SYRINGE
PREFILLED_SYRINGE | INTRAVENOUS | Status: DC | PRN
  Administered 2018-09-18: 10 mg via INTRAVENOUS
  Administered 2018-09-18: 20 mg via INTRAVENOUS
  Administered 2018-09-18: 50 mg via INTRAVENOUS

## 2018-09-18 MED ORDER — LIDOCAINE 2% (20 MG/ML) 5 ML SYRINGE
INTRAMUSCULAR | Status: DC | PRN
  Administered 2018-09-18: 100 mg via INTRAVENOUS

## 2018-09-18 MED ORDER — SODIUM CHLORIDE 0.9 % IV SOLN
INTRAVENOUS | Status: DC
  Administered 2018-09-18 – 2018-09-20 (×6): via INTRAVENOUS

## 2018-09-18 MED ORDER — MIDAZOLAM HCL 5 MG/5ML IJ SOLN
INTRAMUSCULAR | Status: DC | PRN
  Administered 2018-09-18: 2 mg via INTRAVENOUS

## 2018-09-18 MED ORDER — PROPOFOL 10 MG/ML IV BOLUS
INTRAVENOUS | Status: DC | PRN
  Administered 2018-09-18: 230 mg via INTRAVENOUS

## 2018-09-18 MED ORDER — HYDRALAZINE HCL 20 MG/ML IJ SOLN: 10.0000 mg | INTRAMUSCULAR | Status: DC | PRN

## 2018-09-18 MED ORDER — TRAMADOL HCL 50 MG PO TABS: 50.0000 mg | ORAL_TABLET | Freq: Four times a day (QID) | ORAL | Status: DC | PRN

## 2018-09-18 MED ORDER — CHLORHEXIDINE GLUCONATE 4 % EX LIQD: 60.0000 mL | Freq: Once | CUTANEOUS | Status: DC

## 2018-09-18 MED ORDER — ENOXAPARIN SODIUM 40 MG/0.4ML ~~LOC~~ SOLN
40.0000 mg | SUBCUTANEOUS | Status: AC
  Administered 2018-09-18: 40 mg via SUBCUTANEOUS
  Filled 2018-09-18: qty 0.4

## 2018-09-18 MED ORDER — KETAMINE HCL 10 MG/ML IJ SOLN
INTRAMUSCULAR | Status: AC
  Filled 2018-09-18: qty 1

## 2018-09-18 MED ORDER — ONDANSETRON HCL 4 MG/2ML IJ SOLN
4.0000 mg | INTRAMUSCULAR | Status: DC | PRN
  Administered 2018-09-19 (×2): 4 mg via INTRAVENOUS
  Filled 2018-09-18: qty 2

## 2018-09-18 MED ORDER — FENTANYL CITRATE (PF) 100 MCG/2ML IJ SOLN
INTRAMUSCULAR | Status: DC | PRN
  Administered 2018-09-18 (×2): 50 ug via INTRAVENOUS

## 2018-09-18 MED ORDER — METOCLOPRAMIDE HCL 5 MG/ML IJ SOLN
5.0000 mg | Freq: Four times a day (QID) | INTRAMUSCULAR | Status: DC | PRN
  Administered 2018-09-19: 5 mg via INTRAVENOUS
  Filled 2018-09-18: qty 2

## 2018-09-18 MED ORDER — EPHEDRINE SULFATE-NACL 50-0.9 MG/10ML-% IV SOSY
PREFILLED_SYRINGE | INTRAVENOUS | Status: DC | PRN
  Administered 2018-09-18 (×2): 10 mg via INTRAVENOUS

## 2018-09-18 MED ORDER — APREPITANT 40 MG PO CAPS
40.0000 mg | ORAL_CAPSULE | ORAL | Status: AC
  Administered 2018-09-18: 40 mg via ORAL
  Filled 2018-09-18: qty 1

## 2018-09-18 MED ORDER — CELECOXIB 200 MG PO CAPS
400.0000 mg | ORAL_CAPSULE | ORAL | Status: AC
  Administered 2018-09-18: 400 mg via ORAL
  Filled 2018-09-18: qty 2

## 2018-09-18 MED ORDER — SODIUM CHLORIDE 0.9 % IV SOLN
2.0000 g | INTRAVENOUS | Status: AC
  Administered 2018-09-18: 2 g via INTRAVENOUS
  Filled 2018-09-18: qty 2

## 2018-09-18 MED ORDER — SCOPOLAMINE 1 MG/3DAYS TD PT72: 1.0000 | MEDICATED_PATCH | TRANSDERMAL | Status: DC

## 2018-09-18 MED ORDER — DIPHENHYDRAMINE HCL 50 MG/ML IJ SOLN
INTRAMUSCULAR | Status: AC
  Filled 2018-09-18: qty 1

## 2018-09-18 MED ORDER — GABAPENTIN 300 MG PO CAPS
300.0000 mg | ORAL_CAPSULE | ORAL | Status: AC
  Administered 2018-09-18: 300 mg via ORAL
  Filled 2018-09-18: qty 1

## 2018-09-18 MED ORDER — LIDOCAINE 20MG/ML (2%) 15 ML SYRINGE OPTIME
INTRAMUSCULAR | Status: DC | PRN
  Administered 2018-09-18: 1.5 mg/kg/h via INTRAVENOUS

## 2018-09-18 SURGICAL SUPPLY — 76 items
APL SKNCLS STERI-STRIP NONHPOA (GAUZE/BANDAGES/DRESSINGS) ×1
APL SWBSTK 6 STRL LF DISP (MISCELLANEOUS)
APPLICATOR COTTON TIP 6 STRL (MISCELLANEOUS) IMPLANT
APPLICATOR COTTON TIP 6IN STRL (MISCELLANEOUS)
APPLIER CLIP ROT 10 11.4 M/L (STAPLE)
APPLIER CLIP ROT 13.4 12 LRG (CLIP)
APR CLP LRG 13.4X12 ROT 20 MLT (CLIP)
APR CLP MED LRG 11.4X10 (STAPLE)
BAG LAPAROSCOPIC 12 15 PORT 16 (BASKET) IMPLANT
BAG RETRIEVAL 12/15 (BASKET)
BAG RETRIEVAL 12/15MM (BASKET)
BANDAGE ADH SHEER 1  50/CT (GAUZE/BANDAGES/DRESSINGS) ×18 IMPLANT
BENZOIN TINCTURE PRP APPL 2/3 (GAUZE/BANDAGES/DRESSINGS) ×3 IMPLANT
BLADE SURG SZ11 CARB STEEL (BLADE) ×3 IMPLANT
CABLE HIGH FREQUENCY MONO STRZ (ELECTRODE) ×3 IMPLANT
CHLORAPREP W/TINT 26ML (MISCELLANEOUS) ×6 IMPLANT
CLIP APPLIE ROT 10 11.4 M/L (STAPLE) IMPLANT
CLIP APPLIE ROT 13.4 12 LRG (CLIP) IMPLANT
CLOSURE WOUND 1/2 X4 (GAUZE/BANDAGES/DRESSINGS) ×1
COVER SURGICAL LIGHT HANDLE (MISCELLANEOUS) ×3 IMPLANT
COVER WAND RF STERILE (DRAPES) ×2 IMPLANT
DECANTER SPIKE VIAL GLASS SM (MISCELLANEOUS) ×3 IMPLANT
DEVICE SUT QUICK LOAD TK 5 (STAPLE) IMPLANT
DEVICE SUT TI-KNOT TK 5X26 (MISCELLANEOUS) IMPLANT
DEVICE TI KNOT TK5 (MISCELLANEOUS)
DRAPE UTILITY XL STRL (DRAPES) ×6 IMPLANT
ELECT REM PT RETURN 15FT ADLT (MISCELLANEOUS) ×3 IMPLANT
GAUZE SPONGE 4X4 12PLY STRL (GAUZE/BANDAGES/DRESSINGS) IMPLANT
GLOVE BIO SURGEON STRL SZ 6 (GLOVE) ×3 IMPLANT
GLOVE INDICATOR 6.5 STRL GRN (GLOVE) ×3 IMPLANT
GOWN STRL REUS W/TWL LRG LVL3 (GOWN DISPOSABLE) ×3 IMPLANT
GOWN STRL REUS W/TWL XL LVL3 (GOWN DISPOSABLE) ×6 IMPLANT
GRASPER SUT TROCAR 14GX15 (MISCELLANEOUS) ×3 IMPLANT
HOVERMATT SINGLE USE (MISCELLANEOUS) ×3 IMPLANT
KIT BASIN OR (CUSTOM PROCEDURE TRAY) ×3 IMPLANT
MARKER SKIN DUAL TIP RULER LAB (MISCELLANEOUS) ×3 IMPLANT
NDL SPNL 22GX3.5 QUINCKE BK (NEEDLE) ×1 IMPLANT
NEEDLE SPNL 22GX3.5 QUINCKE BK (NEEDLE) ×3 IMPLANT
PACK UNIVERSAL I (CUSTOM PROCEDURE TRAY) ×3 IMPLANT
QUICK LOAD TK 5 (STAPLE)
RELOAD ENDO STITCH (ENDOMECHANICALS) IMPLANT
RELOAD STAPLE 60 3.6 BLU REG (STAPLE) ×1 IMPLANT
RELOAD STAPLE 60 3.8 GOLD REG (STAPLE) ×1 IMPLANT
RELOAD STAPLE 60 4.1 GRN THCK (STAPLE) IMPLANT
RELOAD STAPLER BLUE 60MM (STAPLE) ×3 IMPLANT
RELOAD STAPLER GOLD 60MM (STAPLE) ×1 IMPLANT
RELOAD STAPLER GREEN 60MM (STAPLE) ×2 IMPLANT
RELOAD SUT TRIPLE-STITCH 2-0 (ENDOMECHANICALS) IMPLANT
SCISSORS LAP 5X45 EPIX DISP (ENDOMECHANICALS) ×3 IMPLANT
SET IRRIG TUBING LAPAROSCOPIC (IRRIGATION / IRRIGATOR) ×3 IMPLANT
SHEARS HARMONIC ACE PLUS 45CM (MISCELLANEOUS) ×3 IMPLANT
SLEEVE ADV FIXATION 5X100MM (TROCAR) ×6 IMPLANT
SLEEVE GASTRECTOMY 40FR VISIGI (MISCELLANEOUS) ×3 IMPLANT
SOLUTION ANTI FOG 6CC (MISCELLANEOUS) ×3 IMPLANT
SPONGE LAP 18X18 RF (DISPOSABLE) ×3 IMPLANT
STAPLER ECHELON BIOABSB 60 FLE (MISCELLANEOUS) ×14 IMPLANT
STAPLER ECHELON LONG 60 440 (INSTRUMENTS) ×3 IMPLANT
STAPLER RELOAD BLUE 60MM (STAPLE) ×9
STAPLER RELOAD GOLD 60MM (STAPLE) ×3
STAPLER RELOAD GREEN 60MM (STAPLE) ×6
STRIP CLOSURE SKIN 1/2X4 (GAUZE/BANDAGES/DRESSINGS) ×2 IMPLANT
SUT MNCRL AB 4-0 PS2 18 (SUTURE) ×3 IMPLANT
SUT SURGIDAC NAB ES-9 0 48 120 (SUTURE) IMPLANT
SUT VICRYL 0 TIES 12 18 (SUTURE) ×3 IMPLANT
SYR 10ML ECCENTRIC (SYRINGE) ×3 IMPLANT
SYR 20CC LL (SYRINGE) ×3 IMPLANT
SYR 50ML LL SCALE MARK (SYRINGE) ×3 IMPLANT
TOWEL OR 17X26 10 PK STRL BLUE (TOWEL DISPOSABLE) ×3 IMPLANT
TOWEL OR NON WOVEN STRL DISP B (DISPOSABLE) ×3 IMPLANT
TROCAR ADV FIXATION 5X100MM (TROCAR) ×3 IMPLANT
TROCAR BLADELESS 15MM (ENDOMECHANICALS) ×3 IMPLANT
TROCAR BLADELESS OPT 5 100 (ENDOMECHANICALS) ×3 IMPLANT
TUBING CONNECTING 10 (TUBING) ×2 IMPLANT
TUBING CONNECTING 10' (TUBING) ×1
TUBING ENDO SMARTCAP (MISCELLANEOUS) ×3 IMPLANT
TUBING INSUF HEATED (TUBING) ×3 IMPLANT

## 2018-09-18 NOTE — Transfer of Care (Signed)
Immediate Anesthesia Transfer of Care Note  Patient: Wendy George  Procedure(s) Performed: LAPAROSCOPIC GASTRIC SLEEVE RESECTION, UPPER ENDO, ERAS Pathway (N/A )  Patient Location: PACU  Anesthesia Type:General  Level of Consciousness: sedated  Airway & Oxygen Therapy: Patient Spontanous Breathing and Patient connected to face mask oxygen  Post-op Assessment: Report given to RN and Post -op Vital signs reviewed and stable  Post vital signs: Reviewed and stable  Last Vitals:  Vitals Value Taken Time  BP    Temp    Pulse    Resp    SpO2      Last Pain:  Vitals:   09/18/18 0556  TempSrc: Oral         Complications: No apparent anesthesia complications

## 2018-09-18 NOTE — Discharge Instructions (Signed)
° ° ° °GASTRIC BYPASS/SLEEVE ° Home Care Instructions ° ° These instructions are to help you care for yourself when you go home. ° °Call: If you have any problems. °• Call [phone] and ask for the surgeon on call °• If you need immediate help, come to the ER at [facility].  °• Tell the ER staff that you are a new post-op gastric bypass or gastric sleeve patient °  °Signs and symptoms to report: • Severe vomiting or nausea °o If you cannot keep down clear liquids for longer than 1 day, call your surgeon  °• Abdominal pain that does not get better after taking your pain medication °• Fever over 100.4° F with chills °• Heart beating over 100 beats a minute °• Shortness of breath at rest °• Chest pain °•  Redness, swelling, drainage, or foul odor at incision (surgical) sites °•  If your incisions open or pull apart °• Swelling or pain in calf (lower leg) °• Diarrhea (Loose bowel movements that happen often), frequent watery, uncontrolled bowel movements °• Constipation, (no bowel movements for 3 days) if this happens: Pick one °o Milk of Magnesia, 2 tablespoons by mouth, 3 times a day for 2 days if needed °o Stop taking Milk of Magnesia once you have a bowel movement °o Call your doctor if constipation continues °Or °o Miralax  (instead of Milk of Magnesia) following the label instructions °o Stop taking Miralax once you have a bowel movement °o Call your doctor if constipation continues °• Anything you think is not normal °  °Normal side effects after surgery: • Unable to sleep at night or unable to focus °• Irritability or moody °• Being tearful (crying) or depressed °These are common complaints, possibly related to your anesthesia medications that put you to sleep, stress of surgery, and change in lifestyle.  This usually goes away a few weeks after surgery.  If these feelings continue, call your primary care doctor. °  °Wound Care: You may have surgical glue, steri-strips, or staples over your incisions after  surgery °• Surgical glue:  Looks like a clear film over your incisions and will wear off a little at a time °• Steri-strips: Strips of tape over your incisions. You may notice a yellowish color on the skin under the steri-strips. This is used to make the   steri-strips stick better. Do not pull the steri-strips off - let them fall off °• Staples: Staples may be removed before you leave the hospital °o If you go home with staples, call [facility], [phone] at for an appointment with your surgeon’s nurse to have staples removed 10 days after surgery. °• Showering: You may shower two (2) days after your surgery unless your surgeon tells you differently °o Wash gently around incisions with warm soapy water, rinse well, and gently pat dry  °o No tub baths until staples are removed, steri-strips fall off or glue is gone.  °  °Medications: • Medications should be liquid or crushed if larger than the size of a dime °• Extended release pills (medication that release a little bit at a time through the day) should NOT be crushed or cut. (examples include XL, ER, DR, SR) °• Depending on the size and number of medications you take, you may need to space (take a few throughout the day)/change the time you take your medications so that you do not over-fill your pouch (smaller stomach) °• Make sure you follow-up with your primary care doctor to   make medication changes needed during rapid weight loss and life-style changes °• If you have diabetes, follow up with the doctor that orders your diabetes medication(s) within one week after surgery and check your blood sugar regularly. °• Do not drive while taking prescription pain medication  °• It is ok to take Tylenol by the bottle instructions with your pain medicine or instead of your pain medicine as needed.  DO NOT TAKE NSAIDS (EXAMPLES OF NSAIDS:  IBUPROFREN/ NAPROXEN)  °Diet:                    First 2 Weeks ° You will see the dietician t about two (2) weeks  after your surgery. The dietician will increase the types of foods you can eat if you are handling liquids well: °• If you have severe vomiting or nausea and cannot keep down clear liquids lasting longer than 1 day, call your surgeon @ ([phone]) °Protein Shake °• Drink at least 2 ounces of shake 5-6 times per day °• Each serving of protein shakes (usually 8 - 12 ounces) should have: °o 15 grams of protein  °o And no more than 5 grams of carbohydrate  °• Goal for protein each day: °o Men = 80 grams per day °o Women = 60 grams per day °• Protein powder may be added to fluids such as non-fat milk or Lactaid milk or unsweetened Soy/Almond milk (limit to 35 grams added protein powder per serving) ° °Hydration °• Slowly increase the amount of water and other clear liquids as tolerated (See Acceptable Fluids) °• Slowly increase the amount of protein shake as tolerated  °•  Sip fluids slowly and throughout the day.  Do not use straws. °• May use sugar substitutes in small amounts (no more than 6 - 8 packets per day; i.e. Splenda) ° °Fluid Goal °• The first goal is to drink at least 8 ounces of protein shake/drink per day (or as directed by the nutritionist); some examples of protein shakes are Syntrax Nectar, Adkins Advantage, EAS Edge HP, and Unjury. See handout from pre-op Bariatric Education Class: °o Slowly increase the amount of protein shake you drink as tolerated °o You may find it easier to slowly sip shakes throughout the day °o It is important to get your proteins in first °• Your fluid goal is to drink 64 - 100 ounces of fluid daily °o It may take a few weeks to build up to this °• 32 oz (or more) should be clear liquids  °And  °• 32 oz (or more) should be full liquids (see below for examples) °• Liquids should not contain sugar, caffeine, or carbonation ° °Clear Liquids: °• Water or Sugar-free flavored water (i.e. Fruit H2O, Propel) °• Decaffeinated coffee or tea (sugar-free) °• Crystal Lite, Wyler’s Lite,  Minute Maid Lite °• Sugar-free Jell-O °• Bouillon or broth °• Sugar-free Popsicle:   *Less than 20 calories each; Limit 1 per day ° °Full Liquids: °Protein Shakes/Drinks + 2 choices per day of other full liquids °• Full liquids must be: °o No More Than 15 grams of Carbs per serving  °o No More Than 3 grams of Fat per serving °• Strained low-fat cream soup (except Cream of Potato or Tomato) °• Non-Fat milk °• Fat-free Lactaid Milk °• Unsweetened Soy Or Unsweetened Almond Milk °• Low Sugar yogurt (Dannon Lite & Fit, Greek yogurt; Oikos Triple Zero; Chobani Simply 100; Yoplait 100 calorie Greek - No Fruit on the Bottom) ° °  °Vitamins   and Minerals • Start 1 day after surgery unless otherwise directed by your surgeon °• 2 Chewable Bariatric Specific Multivitamin / Multimineral Supplement with iron (Example: Bariatric Advantage Multi EA) °• Chewable Calcium with Vitamin D-3 °(Example: 3 Chewable Calcium Plus 600 with Vitamin D-3) °o Take 500 mg three (3) times a day for a total of 1500 mg each day °o Do not take all 3 doses of calcium at one time as it may cause constipation, and you can only absorb 500 mg  at a time  °o Do not mix multivitamins containing iron with calcium supplements; take 2 hours apart °• Menstruating women and those with a history of anemia (a blood disease that causes weakness) may need extra iron °o Talk with your doctor to see if you need more iron °• Do not stop taking or change any vitamins or minerals until you talk to your dietitian or surgeon °• Your Dietitian and/or surgeon must approve all vitamin and mineral supplements °  °Activity and Exercise: Limit your physical activity as instructed by your doctor.  It is important to continue walking at home.  During this time, use these guidelines: °• Do not lift anything greater than ten (10) pounds for at least two (2) weeks °• Do not go back to work or drive until your surgeon says you can °• You may have sex when you feel comfortable  °o It is  VERY important for female patients to use a reliable birth control method; fertility often increases after surgery  °o All hormonal birth control will be ineffective for 30 days after surgery due to medications given during surgery a barrier method must be used. °o Do not get pregnant for at least 18 months °• Start exercising as soon as your doctor tells you that you can °o Make sure your doctor approves any physical activity °• Start with a simple walking program °• Walk 5-15 minutes each day, 7 days per week.  °• Slowly increase until you are walking 30-45 minutes per day °Consider joining our BELT program. [phone] or email [email or web address] °  °Special Instructions Things to remember: °• Use your CPAP when sleeping if this applies to you ° °• [facility] has two free Bariatric Surgery Support Groups that meet monthly °o The 3rd Thursday of each month, 6 pm, [facility]  °o The 2nd Friday of each month, 11:45 am in the private dining room in the basement of [facility] °• It is very important to keep all follow up appointments with your surgeon, dietitian, primary care physician, and behavioral health practitioner °• Routine follow up schedule with your surgeon include appointments at 2-3 weeks, 6-8 weeks, 6 months, and 1 year at a minimum.  Your surgeon may request to see you more often.   °o After the first year, please follow up with your bariatric surgeon and dietitian at least once a year in order to maintain best weight loss results °[facility]: [phone] °[facility]: [phone] °Bariatric Nurse Coordinator: [phone] °  °   Reviewed and Endorsed  °by [facility] Patient Education Committee, June, 2016 °Edits Approved: Aug, 2018 ° ° ° °

## 2018-09-18 NOTE — Progress Notes (Signed)
PHARMACY CONSULT FOR:  Risk Assessment for Post-Discharge VTE Following Bariatric Surgery  Post-Discharge VTE Risk Assessment: This patient's probability of 30-day post-discharge VTE is increased due to the factors marked:   Female    Age >/=60 years    BMI >/=50 kg/m2    CHF    Dyspnea at Rest    Paraplegia   x Non-gastric-band surgery    Operation Time >/=3 hr    Return to OR     Length of Stay >/= 3 d   Predicted probability of 30-day post-discharge VTE: 0.16%  Other patient-specific factors to consider:   Recommendation for Discharge: No pharmacologic prophylaxis post-discharge  Wendy George is a 47 y.o. female who underwent laparoscopic sleeve gastrectomy on 12/2   Case start: 0743 Case end: 0859   No Known Allergies  Patient Measurements: Height: 6\' 1"  (185.4 cm) Weight: (!) 315 lb (142.9 kg) IBW/kg (Calculated) : 75.4 Body mass index is 41.56 kg/m.  No results for input(s): WBC, HGB, HCT, PLT, APTT, CREATININE, LABCREA, CREATININE, CREAT24HRUR, MG, PHOS, ALBUMIN, PROT, ALBUMIN, AST, ALT, ALKPHOS, BILITOT, BILIDIR, IBILI in the last 72 hours. Estimated Creatinine Clearance: 137.5 mL/min (by C-G formula based on SCr of 0.78 mg/dL).    Past Medical History:  Diagnosis Date  . Anxiety   . Chronic lower back pain   . Chronic pain in right shoulder   . Depression   . Headache    HX MIGRAINES YRS AGO  . Hypertension   . Sleep apnea    CPAP WITH AUTO SET  . Urinary urgency      Medications Prior to Admission  Medication Sig Dispense Refill Last Dose  . cloNIDine (CATAPRES) 0.1 MG tablet Take 0.1 mg by mouth as needed (if SBP greater then 160/90).    09/17/2018 at Unknown time  . hydrochlorothiazide (HYDRODIURIL) 25 MG tablet Take 25 mg by mouth daily.   09/17/2018 at Unknown time  . traZODone (DESYREL) 100 MG tablet Take 100 mg by mouth at bedtime as needed for sleep. TAKES 50 MG OF 100 MG TABLET   09/17/2018 at Unknown time       Valentina GuChristy, Wendy George  D 09/18/2018,1:47 PM

## 2018-09-18 NOTE — Op Note (Signed)
Operative Note  Wendy George  [id number]  [id number]  09/18/2018   Surgeon: Lady Deutscherhelsea A ConnorMD  Assistant: Ovidio Kinavid Newman MD  Procedure performed: laparoscopic sleeve gastrectomy, upper endoscopy  Preop diagnosis: Morbid obesity Body mass index is 41.56 kg/m., obstructive sleep apnea, urinary urgency, hypertension, depression, anxiety, chronic low back and shoulder pain Post-op diagnosis/intraop findings: same  Specimens: fundus Retained items: none EBL: minimal cc Complications: none  Description of procedure: After obtaining informed consent and administration of prophylactic lovenox in holding, the patient was taken to the operating room and placed supine on operating room table wheregeneral endotracheal anesthesia was initiated, preoperative antibiotics were administered, SCDs applied, and a formal timeout was performed. The abdomen was prepped and draped in usual sterile fashion. Peritoneal access was gained using a Visiport technique in the left upper quadrant and insufflation to 15 mmHg ensued without issue. Gross inspection revealed no evidence of injury. Under direct visualization three more 5 mm trochars were placed in the right and left hemiabdomen and the 15mm trocar in the right paramedian upper abdomen. Bilateral laparoscopic assisted TAPS blocks were performed with Exparel diluted with 0.25 percent Marcaine with epinephrine. The patient was placed in steep Trendelenburg and the liver retractor was introduced through an incision in the upper midline and secured to the post externally to maintain the left lobe retracted anteriorly. There was no evidence of hiatal hernia. Using the Harmonic scalpel, the greater curvature of the stomach was dissected away from the greater omentum and short gastric vessels were divided. This began 6 cm from the pylorus, and dissection proceeded until the left crus was clearly exposed. Esophageal fat pad was mobilized off the anterior stomach  slightly. The 6940 [place] VisiGi was then introduced and directed down towards the pylorus. This was placed to suction against the lesser curve. Serial fires of the linear cutting stapler with seamguards were then employed to create our sleeve. The first 2 fires used a green load and ensured adequate room at the angularis incisura. One gold load and then 3 blue loads were then employed to create a narrow tubular stomach  up to the angle of His. The excised stomach was then removed through our 15 mm trocar site within an Endo Catch bag. The visigi was taken off of suction and a few puffs of air were introduced, inflating the sleeve. No bubbles were observed and the irrigation fluid around the stomach and the shape was noted to be a nice smooth tube without any narrowing at the angularis. The visigi was then removed. Upper endoscopy was performed by the assistant surgeon and the sleeve was noted to be airtight, the staple line was hemostatic. Please see his separate note. The endoscope was removed. The 15 mm trocar site fascia in the right upper abdomen was closed with 2 interrupted sutures of 0 Vicryl using the laparoscopic suture passer under direct visualization. The liver retractor was removed under direct visualization. The abdomen was then desufflated and all remaining trochars removed. The skin incisions were closed with running subcuticular Monocryl; benzoin, Steri-Strips and Band-Aids were applied The patient was then awakened, extubated and taken to PACU in stable condition.    All counts were correct at the completion of the case.

## 2018-09-18 NOTE — Progress Notes (Signed)
Patient sleeping family at bedside.  Discussed plan of care.

## 2018-09-18 NOTE — Anesthesia Procedure Notes (Signed)
Procedure Name: Intubation Date/Time: 09/18/2018 7:24 AM Performed by: Lind Covert, CRNA Pre-anesthesia Checklist: Patient identified, Emergency Drugs available, Suction available and Patient being monitored Patient Re-evaluated:Patient Re-evaluated prior to induction Oxygen Delivery Method: Circle system utilized Preoxygenation: Pre-oxygenation with 100% oxygen Induction Type: IV induction Ventilation: Mask ventilation without difficulty Laryngoscope Size: Mac and 4 Grade View: Grade II Tube type: Oral Tube size: 7.5 mm Number of attempts: 1 Airway Equipment and Method: Stylet Placement Confirmation: ETT inserted through vocal cords under direct vision,  positive ETCO2 and breath sounds checked- equal and bilateral Secured at: 22 cm Tube secured with: Tape Dental Injury: Teeth and Oropharynx as per pre-operative assessment

## 2018-09-18 NOTE — Op Note (Signed)
Name:  Wendy George: [id number] Date of Surgery: 09/18/2018  Preop Diagnosis:  Morbid Obesity  Postop Diagnosis:  Morbid Obesity (Weight - 310, BMI - 41.6), S/P Gastric Sleeve resection  Procedure:  Upper endoscopy  (Intraoperative)  Surgeon:  Ovidio Kinavid Harshini Trent, M.D.  Anesthesia:  GET  Indications for procedure: Wendy George is a 47 y.o. female whose primary care physician is Shelbie AmmonsHaque, Imran P, MD and has completed a gastric sleeve resection today for weight loss by Dr. Fredricka Bonineonnor.  I am doing an intraoperative upper endoscopy to evaluate the gastric pouch after the sleeve gastrectomy.  Operative Note: The patient is under general anesthesia.  Dr. Fredricka Bonineonnor is laparoscoping the patient while I do an upper endoscopy to evaluate the stomach pouch.  With the patient intubated, I passed the Olympus upper endoscope without difficulty down the esophagus.  The esophagus was unremarkable.  The esophago-gastric junction was at 41 cm.    The mucosa of the stomach looked viable and the staple line was intact without bleeding.  I advanced the scope to the pylorus, but did not go through it.  While I insufflated the stomach pouch with air, Dr. Fredricka Bonineonnor  flooded the upper abdomen with saline to put the gastric pouch under saline.  There was no bubbling or evidence of a leak.  There was no evidence of narrowing of the pouch and the gastric sleeve looked tubular.  The scope was then withdrawn.  The esophagus was unremarkable and the patient tolerated the endoscopy without difficulty.  Ovidio Kinavid Mckynna Vanloan, MD, [facility] Pager: [phone] Office phone:  [phone]

## 2018-09-18 NOTE — Interval H&P Note (Signed)
History and Physical Interval Note:  09/18/2018 7:01 AM  Wendy George  has presented today for surgery, with the diagnosis of MORBID OBESITY  The various methods of treatment have been discussed with the patient and family. After consideration of risks, benefits and other options for treatment, the patient has consented to  Procedure(s): LAPAROSCOPIC GASTRIC SLEEVE RESECTION, UPPER ENDO, ERAS Pathway (N/A) as a surgical intervention .  The patient's history has been reviewed, patient examined, no change in status, stable for surgery.  I have reviewed the patient's chart and labs.  Questions were answered to the patient's satisfaction.     Wendy George [name]

## 2018-09-18 NOTE — Anesthesia Postprocedure Evaluation (Signed)
Anesthesia Post Note  Patient: Wendy George  Procedure(s) Performed: LAPAROSCOPIC GASTRIC SLEEVE RESECTION, UPPER ENDO, ERAS Pathway (N/A )     Patient location during evaluation: PACU Anesthesia Type: General Level of consciousness: sedated Pain management: pain level controlled Vital Signs Assessment: post-procedure vital signs reviewed and stable Respiratory status: spontaneous breathing and respiratory function stable Cardiovascular status: stable Postop Assessment: no apparent nausea or vomiting Anesthetic complications: no    Last Vitals:  Vitals:   09/18/18 1015 09/18/18 1030  BP: 124/75 129/75  Pulse: (!) 55 66  Resp: 14 16  Temp:    SpO2: 100% 100%    Last Pain:  Vitals:   09/18/18 1015  TempSrc:   PainSc: Asleep                 Burris Matherne DANIEL

## 2018-09-19 ENCOUNTER — Encounter (HOSPITAL_COMMUNITY): Payer: Self-pay | Admitting: Surgery

## 2018-09-19 LAB — COMPREHENSIVE METABOLIC PANEL
ALBUMIN: 3.7 g/dL (ref 3.5–5.0)
ALT: 14 U/L (ref 0–44)
AST: 16 U/L (ref 15–41)
Alkaline Phosphatase: 40 U/L (ref 38–126)
Anion gap: 9 (ref 5–15)
BUN: 6 mg/dL (ref 6–20)
CO2: 25 mmol/L (ref 22–32)
Calcium: 8.6 mg/dL — ABNORMAL LOW (ref 8.9–10.3)
Chloride: 105 mmol/L (ref 98–111)
Creatinine, Ser: 0.85 mg/dL (ref 0.44–1.00)
GFR calc Af Amer: >60 mL/min (ref >60)
GFR calc non Af Amer: >60 mL/min (ref >60)
GLUCOSE: 109 mg/dL — AB (ref 70–99)
Potassium: 3.7 mmol/L (ref 3.5–5.1)
Sodium: 139 mmol/L (ref 135–145)
Total Bilirubin: 1 mg/dL (ref 0.3–1.2)
Total Protein: 6.2 g/dL — ABNORMAL LOW (ref 6.5–8.1)

## 2018-09-19 LAB — CBC WITH DIFFERENTIAL/PLATELET
Abs Immature Granulocytes: 0.04 10*3/uL (ref 0.00–0.07)
BASOS ABS: 0 10*3/uL (ref 0.0–0.1)
Basophils Relative: 0 %
EOS PCT: 0 %
Eosinophils Absolute: 0 10*3/uL (ref 0.0–0.5)
HCT: 37.9 % (ref 36.0–46.0)
HEMOGLOBIN: 12.4 g/dL (ref 12.0–15.0)
Immature Granulocytes: 0 %
LYMPHS PCT: 16 %
Lymphs Abs: 1.7 10*3/uL (ref 0.7–4.0)
MCH: 29.6 pg (ref 26.0–34.0)
MCHC: 32.7 g/dL (ref 30.0–36.0)
MCV: 90.5 fL (ref 80.0–100.0)
Monocytes Absolute: 0.7 10*3/uL (ref 0.1–1.0)
Monocytes Relative: 7 %
Neutro Abs: 7.8 10*3/uL — ABNORMAL HIGH (ref 1.7–7.7)
Neutrophils Relative %: 77 %
Platelets: 212 10*3/uL (ref 150–400)
RBC: 4.19 MIL/uL (ref 3.87–5.11)
RDW: 13.2 % (ref 11.5–15.5)
WBC: 10.2 10*3/uL (ref 4.0–10.5)
nRBC: 0 % (ref 0.0–0.2)

## 2018-09-19 MED ORDER — ONDANSETRON 4 MG PO TBDP: 4.0000 mg | ORAL_TABLET | Freq: Four times a day (QID) | ORAL | 0 refills | Status: DC | PRN

## 2018-09-19 MED ORDER — METOCLOPRAMIDE HCL 5 MG/ML IJ SOLN
5.0000 mg | Freq: Four times a day (QID) | INTRAMUSCULAR | Status: DC | PRN
  Filled 2018-09-19: qty 2

## 2018-09-19 MED ORDER — METOCLOPRAMIDE HCL 5 MG/ML IJ SOLN
5.0000 mg | Freq: Four times a day (QID) | INTRAMUSCULAR | Status: DC
  Administered 2018-09-19 – 2018-09-20 (×3): 5 mg via INTRAVENOUS
  Filled 2018-09-19 (×2): qty 2

## 2018-09-19 MED ORDER — PANTOPRAZOLE SODIUM 40 MG PO TBEC: 40.0000 mg | DELAYED_RELEASE_TABLET | Freq: Every day | ORAL | 0 refills | Status: AC

## 2018-09-19 MED ORDER — TRAMADOL HCL 50 MG PO TABS: 50.0000 mg | ORAL_TABLET | Freq: Four times a day (QID) | ORAL | 0 refills | Status: DC | PRN

## 2018-09-19 NOTE — Progress Notes (Signed)
S: Slept well. Has had issues with nausea. No dysphagia or reflux. Pain well controlled. Has walked in the halls.   Vitals, labs, intake/output, and orders reviewed at this time. -Tmax 99. No tachycardia, HR 52-80. Normo- to mildly hypertensive now 138/61. Sats 100% room air.  -PO 120mL. UOP 4250mL -CMP unremarkable. WBC 10.2 (6.3 preop), Hgb 12.4 (13 preop), plts 212 (213 preop) -Meds - received lovenox at 9pm. sch tylenol and gabapentin. Has received no PRN pain meds (dilaudid, oxycodone, tramadol, robaxin  on mar). Received Reglan at 6am today, zofran at 4am today. Simethicone at 9pm.    Gen: A&Ox3, no distress H&N: EOMI, atraumatic, neck supple Chest: unlabored respirations, RRR Abd: soft, nontender, nondistended, incisions c/d/i with bandaids/steris, no hematoma or cellulitis Ext: warm, no edema Neuro: grossly normal  Lines/tubes/drains: PIV  A/P:  POD 1 laparoscopic sleeve gastrectomy, nausea an issue -Continue clears and protein shakes as tolerated -Ambulate in halls -Pulmonary toilet -Continue to monitor, if PO intake progresses and nausea improves possible discharge this afternoon.   Phylliss Blakeshelsea Connor, MD [facility], [place] Pager [phone]

## 2018-09-19 NOTE — Progress Notes (Signed)
Encouraged patient to ambulate in hallway. She hasn't drank any protein shake at this time. Will continue to monitor.

## 2018-09-19 NOTE — Discharge Summary (Signed)
Physician Discharge Summary  Wendy George:[id number] DOB: 01-12-1971 DOA: 09/18/2018  PCP: Raelyn Number, MD  Admit date: 09/18/2018 Discharge date:  09/20/2018   Recommendations for Outpatient Follow-up:   Follow-up Information    Clovis Riley, MD. Go on 10/05/2018.   Specialty:  General Surgery Why:  at 920.  Please arrive 15 minutes early for your appointment. Contact information: [address] [place] [address] [phone]        Clovis Riley, MD .   Specialty:  General Surgery Contact information: [address] [place] [address] [phone]          Discharge Diagnoses:  Active Problems:   Morbid obesity ([place])   Surgical Procedure: Laparoscopic Sleeve Gastrectomy, upper endoscopy  Discharge Condition: Good Disposition: Home  Diet recommendation: Postoperative sleeve gastrectomy diet (liquids only)  Filed Weights   09/18/18 0557  Weight: (!) 142.9 kg     Hospital Course:  The patient was admitted for a planned laparoscopic sleeve gastrectomy. Please see operative note. Preoperatively the patient was given lovenox for DVT prophylaxis. Postoperative prophylactic Lovenox dosing was started on the evening of postoperative day 0. ERAS protocol was used.   On the evening of postoperative day 0, the patient was started on water and ice chips. On postoperative day 1 the patient had no fever or tachycardia and was tolerating water in their diet was gradually advanced throughout the day.   She had significant nausea which was managed with reglan and zofran, but was kept an additional day to ensure adequate PO intake. The patient was ambulating without difficulty. Their vital signs are stable without fever or tachycardia. Their hemoglobin had remained stable. The patient was maintained on their home settings for CPAP therapy.   She is now 2 days post op.  Her mother and one of her daughters are in the  room with her.  Her nausea is better and she is ready to go home. The patient had received discharge instructions and counseling. They were deemed stable for discharge and had met discharge criteria  Discharge Instructions  Discharge Instructions    Ambulate hourly while awake   Complete by:  As directed    Call MD for:  difficulty breathing, headache or visual disturbances   Complete by:  As directed    Call MD for:  persistant dizziness or light-headedness   Complete by:  As directed    Call MD for:  persistant nausea and vomiting   Complete by:  As directed    Call MD for:  redness, tenderness, or signs of infection (pain, swelling, redness, odor or green/yellow discharge around incision site)   Complete by:  As directed    Call MD for:  severe uncontrolled pain   Complete by:  As directed    Call MD for:  temperature >101 F   Complete by:  As directed    Diet bariatric full liquid   Complete by:  As directed    Incentive spirometry   Complete by:  As directed    Perform hourly while awake     Allergies as of 09/19/2018   No Known Allergies     Medication List    STOP taking these medications   hydrochlorothiazide 25 MG tablet Commonly known as:  HYDRODIURIL     TAKE these medications   cloNIDine 0.1 MG tablet Commonly known as:  CATAPRES Take 0.1 mg by mouth as needed (if SBP greater then  160/90). Notes to patient:  Monitor Blood Pressure Daily and keep a log for primary care physician.  You may need to make changes to your medications with rapid weight loss.     ondansetron 4 MG disintegrating tablet Commonly known as:  ZOFRAN-ODT Take 1 tablet (4 mg total) by mouth every 6 (six) hours as needed for nausea or vomiting.   pantoprazole 40 MG tablet Commonly known as:  PROTONIX Take 1 tablet (40 mg total) by mouth daily.   traMADol 50 MG tablet Commonly known as:  ULTRAM Take 1 tablet (50 mg total) by mouth every 6 (six) hours as needed (pain).   traZODone 100  MG tablet Commonly known as:  DESYREL Take 100 mg by mouth at bedtime as needed for sleep. TAKES 50 MG OF 100 MG TABLET      Follow-up Information    Clovis Riley, MD. Go on 10/05/2018.   Specialty:  General Surgery Why:  at 920.  Please arrive 15 minutes early for your appointment. Contact information: [address] [place] [address] [phone]        Clovis Riley, MD .   Specialty:  General Surgery Contact information: [address] [place] [address] [phone]            The results of significant diagnostics from this hospitalization (including imaging, microbiology, ancillary and laboratory) are listed below for reference.    Significant Diagnostic Studies: Dg Lumbar Spine Complete  Result Date: 08/27/2018 CLINICAL DATA:  Acute onset of lower back pain. EXAM: LUMBAR SPINE - COMPLETE 4+ VIEW COMPARISON:  Lumbar spine radiographs performed 07/03/2010, and CT of the abdomen and pelvis performed 05/28/2011 FINDINGS: There is no evidence of fracture or subluxation. Vertebral bodies demonstrate normal height and alignment. Intervertebral disc spaces are preserved. The visualized neural foramina are grossly unremarkable in appearance. The visualized bowel gas pattern is unremarkable in appearance; air and stool are noted within the colon. The sacroiliac joints are within normal limits. IMPRESSION: No evidence of fracture or subluxation along the lumbar spine. Electronically Signed   By: Garald Balding M.D.   On: 08/27/2018 05:00    Labs: Basic Metabolic Panel: Recent Labs  Lab 09/19/18 0455  NA 139  K 3.7  CL 105  CO2 25  GLUCOSE 109*  BUN 6  CREATININE 0.85  CALCIUM 8.6*   Liver Function Tests: Recent Labs  Lab 09/19/18 0455  AST 16  ALT 14  ALKPHOS 40  BILITOT 1.0  PROT 6.2*  ALBUMIN 3.7    CBC: Recent Labs  Lab 09/19/18 0455  WBC 10.2  NEUTROABS 7.8*  HGB 12.4  HCT 37.9  MCV 90.5   PLT 212    CBG: No results for input(s): GLUCAP in the last 168 hours.  Active Problems:   Morbid obesity ([place])   Signed: Alphonsa Overall, MD, [facility] Surgery Pager: [phone] Office phone:  [phone]

## 2018-09-19 NOTE — Plan of Care (Signed)

## 2018-09-19 NOTE — Progress Notes (Signed)
RN is encouraging patient to drink her protein but she just falls back to sleep.

## 2018-09-19 NOTE — Progress Notes (Addendum)
Patient nauseated and refusing zofran, fearing that "it will make me sleepy". Explained to patient that zofran does not make you sleepy. Patient responded "medication that does not bother other people have the opposite effect on me and it makes me sleepy".  Pt refused zofran.  Lina SarBeth Demetrica Zipp, RN

## 2018-09-19 NOTE — Progress Notes (Addendum)
Started protein shake.  Still complaints of nausea at times.  Slow oral intake throughout the day. Zofran helps with nausea per patient.  Sensitive to smells at this time in room.  Aromatherapy provided to patient.

## 2018-09-19 NOTE — Care Management Note (Signed)
Case Management Note  Patient Details  Name: Wendy George MRN: [id number] Date of Birth: 05/03/1971  Subjective/Objective: Morbid obesity. S/p sleeve gastrectomy. From home. No CM needs.                   Action/Plan:dc home.   Expected Discharge Date:  09/19/18               Expected Discharge Plan:  Home/Self Care  In-House Referral:     Discharge planning Services  CM Consult  Post Acute Care Choice:    Choice offered to:     DME Arranged:    DME Agency:     HH Arranged:    HH Agency:     Status of Service:  Completed, signed off  If discussed at [employer] Length of Stay Meetings, dates discussed:    Additional Comments:  Lanier ClamMahabir, Jasimine Simms, RN 09/19/2018, 11:20 AM

## 2018-09-19 NOTE — Progress Notes (Signed)
Patient states "I think that liquid Tylenol is what's making me sick; I don't want to take it".  Lina SarBeth Tuere Nwosu, RN

## 2018-09-19 NOTE — Progress Notes (Addendum)
Continues to work on fluids.  On 4th cup of water.  Nausea controlled with Reglan.

## 2018-09-19 NOTE — Progress Notes (Signed)
Patient with decreased nausea. In bed; encouraged to ambulate and sit in chair. Patient has not taken any of the vanilla shake brought to her. Declines Unjury broth and another popsicle. Encouraged to order a Bariatric tray for dinner. Also had encouraged her to order a Bariatric tray for lunch, but patient declined. Patient appears drowsy but states "I will get up and walk in a few minutes".  Daughter in room and is encouraging patient. Lina SarBeth Arlayne Liggins, RN

## 2018-09-20 LAB — CBC WITH DIFFERENTIAL/PLATELET
Abs Immature Granulocytes: 0.01 10*3/uL (ref 0.00–0.07)
Basophils Absolute: 0 10*3/uL (ref 0.0–0.1)
Basophils Relative: 0 %
EOS PCT: 0 %
Eosinophils Absolute: 0 10*3/uL (ref 0.0–0.5)
HEMATOCRIT: 38.5 % (ref 36.0–46.0)
HEMOGLOBIN: 12.3 g/dL (ref 12.0–15.0)
Immature Granulocytes: 0 %
Lymphocytes Relative: 25 %
Lymphs Abs: 2.1 10*3/uL (ref 0.7–4.0)
MCH: 28.7 pg (ref 26.0–34.0)
MCHC: 31.9 g/dL (ref 30.0–36.0)
MCV: 89.7 fL (ref 80.0–100.0)
Monocytes Absolute: 0.7 10*3/uL (ref 0.1–1.0)
Monocytes Relative: 8 %
Neutro Abs: 5.7 10*3/uL (ref 1.7–7.7)
Neutrophils Relative %: 67 %
Platelets: 220 10*3/uL (ref 150–400)
RBC: 4.29 MIL/uL (ref 3.87–5.11)
RDW: 13.2 % (ref 11.5–15.5)
WBC: 8.6 10*3/uL (ref 4.0–10.5)
nRBC: 0 % (ref 0.0–0.2)

## 2018-09-20 NOTE — Progress Notes (Signed)
Discharge instructions discussed with patient, verbalized agreement and understanding 

## 2018-09-20 NOTE — Progress Notes (Signed)
Patient alert and oriented, pain is controlled. Patient is tolerating fluids, advanced to protein shake today, patient is tolerating well.  Reviewed Gastric sleeve discharge instructions with patient and patient is able to articulate understanding.  Provided information on BELT program, Support Group and [facility] outpatient pharmacy. All questions answered, will continue to monitor.  Total fluid intake 690 Per dehydration protocol call back one week post op 

## 2018-09-25 ENCOUNTER — Telehealth (HOSPITAL_COMMUNITY): Payer: Self-pay

## 2018-09-25 NOTE — Telephone Encounter (Signed)
Patient called to discuss post bariatric surgery follow up questions.  See below:   1.  Tell me about your pain and pain management?no pain since discharge  2.  Let's talk about fluid intake.  How much total fluid are you taking in?54 ounces  3.  How much protein have you taken in the last 2 days?60 grams  4.  Have you had nausea?  Tell me about when have experienced nausea and what you did to help?no nausea since discharge  5.  Has the frequency or color changed with your urine?light in color 6.  Tell me what your incisions look like?no problems  7.  Have you been passing gas? BM?passing gas had bm after taking something to help.  Stopped linezz with discuss with dr Fredricka Bonineconnor if should start back on next appointment  8.  If a problem or question were to arise who would you call?  Do you know contact numbers for [facility], [facility], and [facility]?aware of how to contact all services  9.  How has the walking going?walking frequently without difficulty  10.  How are your vitamins and calcium going?  How are you taking them?mvi and calcium without difficulty

## 2018-10-03 ENCOUNTER — Encounter: Payer: BLUE CROSS/BLUE SHIELD | Attending: Surgery | Admitting: Skilled Nursing Facility1

## 2018-10-03 DIAGNOSIS — G4730 Sleep apnea, unspecified: Secondary | ICD-10-CM | POA: Insufficient documentation

## 2018-10-03 DIAGNOSIS — Z833 Family history of diabetes mellitus: Secondary | ICD-10-CM | POA: Insufficient documentation

## 2018-10-03 DIAGNOSIS — Z713 Dietary counseling and surveillance: Secondary | ICD-10-CM | POA: Insufficient documentation

## 2018-10-03 DIAGNOSIS — Z800 Family history of malignant neoplasm of digestive organs: Secondary | ICD-10-CM | POA: Insufficient documentation

## 2018-10-03 DIAGNOSIS — Z9889 Other specified postprocedural states: Secondary | ICD-10-CM | POA: Insufficient documentation

## 2018-10-03 DIAGNOSIS — I10 Essential (primary) hypertension: Secondary | ICD-10-CM | POA: Insufficient documentation

## 2018-10-03 DIAGNOSIS — Z86010 Personal history of colonic polyps: Secondary | ICD-10-CM | POA: Insufficient documentation

## 2018-10-03 DIAGNOSIS — M1990 Unspecified osteoarthritis, unspecified site: Secondary | ICD-10-CM | POA: Insufficient documentation

## 2018-10-04 ENCOUNTER — Encounter: Payer: Self-pay | Admitting: Skilled Nursing Facility1

## 2018-10-04 NOTE — Progress Notes (Signed)
Bariatric Class:  Appt start time: 1530 end time:  1630.  2 Week Post-Operative Nutrition Class  Patient was seen on 10/03/2018 for Post-Operative Nutrition [facility].   Surgery date:  Surgery type: sleeve Start weight at [facility]: 313.2 Weight today: 292  TANITA  BODY COMP RESULTS  10/03/2018   BMI (kg/m^2) 38.5   Fat Mass (lbs) 147.4   Fat Free Mass (lbs) 144.6   Total Body Water (lbs) 107   The following the learning objectives were met by the patient during this course:  Identifies Phase 3A (Soft, High Proteins) Dietary Goals and will begin from 2 weeks post-operatively to 2 months post-operatively  Identifies appropriate sources of fluids and proteins   States protein recommendations and appropriate sources post-operatively  Identifies the need for appropriate texture modifications, mastication, and bite sizes when consuming solids  Identifies appropriate multivitamin and calcium sources post-operatively  Describes the need for physical activity post-operatively and will follow MD recommendations  States when to call healthcare provider regarding medication questions or post-operative complications  Handouts given during class include:  Phase 3A: Soft, High Protein Diet Handout  Follow-Up Plan: Patient will follow-up at [facility] in 6 weeks for 2 month post-op nutrition visit for diet advancement per MD.

## 2018-10-09 ENCOUNTER — Telehealth: Payer: Self-pay | Admitting: Skilled Nursing Facility1

## 2018-10-09 NOTE — Telephone Encounter (Signed)
RD called pt to verify fluid intake once starting soft, solid proteins 2 week post-bariatric surgery.   Daily Fluid intake: Daily Protein intake:  Concerns/issues:    Pts Mailbox is full

## 2018-11-16 ENCOUNTER — Ambulatory Visit: Payer: BLUE CROSS/BLUE SHIELD | Admitting: Skilled Nursing Facility1

## 2018-11-23 ENCOUNTER — Ambulatory Visit: Payer: BLUE CROSS/BLUE SHIELD | Admitting: Skilled Nursing Facility1

## 2018-11-27 ENCOUNTER — Encounter: Payer: BLUE CROSS/BLUE SHIELD | Attending: Surgery | Admitting: Skilled Nursing Facility1

## 2018-11-27 ENCOUNTER — Ambulatory Visit: Payer: BLUE CROSS/BLUE SHIELD | Admitting: Skilled Nursing Facility1

## 2018-11-27 ENCOUNTER — Encounter: Payer: Self-pay | Admitting: Skilled Nursing Facility1

## 2018-11-27 DIAGNOSIS — Z9889 Other specified postprocedural states: Secondary | ICD-10-CM | POA: Diagnosis not present

## 2018-11-27 DIAGNOSIS — Z833 Family history of diabetes mellitus: Secondary | ICD-10-CM | POA: Insufficient documentation

## 2018-11-27 DIAGNOSIS — I10 Essential (primary) hypertension: Secondary | ICD-10-CM | POA: Diagnosis not present

## 2018-11-27 DIAGNOSIS — Z800 Family history of malignant neoplasm of digestive organs: Secondary | ICD-10-CM | POA: Diagnosis not present

## 2018-11-27 DIAGNOSIS — Z86010 Personal history of colonic polyps: Secondary | ICD-10-CM | POA: Diagnosis not present

## 2018-11-27 DIAGNOSIS — Z713 Dietary counseling and surveillance: Secondary | ICD-10-CM | POA: Diagnosis not present

## 2018-11-27 DIAGNOSIS — M1990 Unspecified osteoarthritis, unspecified site: Secondary | ICD-10-CM | POA: Insufficient documentation

## 2018-11-27 DIAGNOSIS — G4730 Sleep apnea, unspecified: Secondary | ICD-10-CM | POA: Insufficient documentation

## 2018-11-27 NOTE — Progress Notes (Signed)
Follow-up visit:  8 Weeks Post-Operative sleeve Surgery  Primary concerns today: Post-operative Bariatric Surgery Nutrition Management.   Pt states even with protonix she has burning in her chest with her surgeon advising she takes a day but pt states she has only taken it once because she does not want to take it twice. Pt states she tried chips and did not like their taste anymore. Pt states she is getting ready to change jobs.   Pt states she will e-mail her multi to dietitian to ensure it is the right one.   Surgery date: 09/19/2019 Surgery type: sleeve Start weight at [facility]: 313.2 Weight today:268.8 Weight change: 23.2  TANITA  BODY COMP RESULTS  10/03/2018 11/27/2018   BMI (kg/m^2) 38.5 35.5   Fat Mass (lbs) 147.4 124.2   Fat Free Mass (lbs) 144.6 144.6   Total Body Water (lbs) 107 106.2   24-hr recall: B (AM): protein shake or egg and bacon  Snk (AM): protein shake L (PM): 1 string cheese with almonds  Snk (PM):  D (PM): mussles or shrimp  Snk (PM): sugar free popcicle   Fluid intake: water with crystal light,  Estimated total protein intake: 80+  Medications: see list Supplementation: multi and calcium   Using straws: no Drinking while eating: no Having you been chewing well:no Chewing/swallowing difficulties: no Changes in vision: no Changes to mood/headaches: no Hair loss/Cahnges to skin/Changes to nails: no Any difficulty focusing or concentrating: no Sweating: no Dizziness/Lightheaded:  Palpitations: no  Carbonated beverages: no N/V/D/C/GAS: taking lactulose  Abdominal Pain: no Dumping syndrome: no  Recent physical activity:    Progress Towards Goal(s):  In progress.  Handouts given during visit include:  Non starchy veggies + protein   Intervention:  Nutrition counseling. Pts diet was advanced to the next phase now including non starchy veggies. Due to the bodies need for essential vitamins, minerals, and fats the pt was educated on the need to  consume a certain amount of calories as well as certain nutrients daily. Pt was educated on the need for daily physical activity and to reach a goal of at least 150 minutes of moderate to vigorous physical activity as directed by their physician due to such benefits as increased musculature and improved lab values.   Goals: -Continue to aim for a minimum of 70 ounces -Eat non-starchy vegetables 2 times a day 7 days a week -Start out with soft cooked vegetables today and tomorrow; if tolerated begin to eat raw vegetables or cooked including salads -Eat your 3 ounces of protein first then start in on your non-starchy vegetables; once you understand how much of your meal leads to satisfaction and not full while still eating 3 ounces of protein and non-starchy vegetables you can eat them in any order  -Continue to aim for 30 minutes of activity at least 5 times a week  Teaching Method Utilized:  Visual Auditory Hands on  Barriers to learning/adherence to lifestyle change: none identified   Demonstrated degree of understanding via:  Teach Back   Monitoring/Evaluation:  Dietary intake, exercise, and body weight. Follow up in 3-4 months

## 2018-11-27 NOTE — Patient Instructions (Addendum)
-  Continue to aim for a minimum of 70 ounces a day 7 days a week  -Eat non-starchy vegetables 2 times a day 7 days a week  -Start out with soft cooked vegetables today and tomorrow; if tolerated begin to eat raw vegetables or cooked including salads  -Eat your 3 ounces of protein first then start in on your non-starchy vegetables; once you understand how much of your meal leads to satisfaction and not full while still eating 3 ounces of protein and non-starchy vegetables you can eat them in any order   -Continue to aim for 30 minutes of activity at least 5 times a week

## 2019-03-23 ENCOUNTER — Other Ambulatory Visit: Payer: Self-pay | Admitting: Internal Medicine

## 2019-03-23 DIAGNOSIS — N6310 Unspecified lump in the right breast, unspecified quadrant: Secondary | ICD-10-CM

## 2019-03-26 ENCOUNTER — Ambulatory Visit
Admission: RE | Admit: 2019-03-26 | Discharge: 2019-03-26 | Disposition: A | Discharge status: Home / Self Care | Payer: BC Managed Care – PPO | Source: Ambulatory Visit | Attending: Internal Medicine | Admitting: Internal Medicine

## 2019-03-26 ENCOUNTER — Other Ambulatory Visit: Payer: Self-pay

## 2019-03-26 ENCOUNTER — Encounter (INDEPENDENT_AMBULATORY_CARE_PROVIDER_SITE_OTHER): Payer: Self-pay

## 2019-03-26 ENCOUNTER — Other Ambulatory Visit: Payer: Self-pay | Admitting: Internal Medicine

## 2019-03-26 DIAGNOSIS — N6310 Unspecified lump in the right breast, unspecified quadrant: Secondary | ICD-10-CM

## 2019-03-26 DIAGNOSIS — N6001 Solitary cyst of right breast: Secondary | ICD-10-CM

## 2019-03-27 ENCOUNTER — Ambulatory Visit: Payer: BLUE CROSS/BLUE SHIELD

## 2019-03-29 ENCOUNTER — Other Ambulatory Visit: Payer: Self-pay

## 2019-03-29 ENCOUNTER — Ambulatory Visit
Admission: RE | Admit: 2019-03-29 | Discharge: 2019-03-29 | Disposition: A | Discharge status: Home / Self Care | Payer: BC Managed Care – PPO | Source: Ambulatory Visit | Attending: Internal Medicine | Admitting: Internal Medicine

## 2019-03-29 DIAGNOSIS — N6001 Solitary cyst of right breast: Secondary | ICD-10-CM

## 2020-04-15 ENCOUNTER — Encounter (HOSPITAL_COMMUNITY): Payer: Self-pay

## 2020-04-26 IMAGING — US US BREAST ASPIRATION
1 series · 3 of 3 positions shown · non-contrast
Comparison: Previous exams.

CLINICAL DATA: 47-year-old female with a painful right breast cyst.

EXAM:
ULTRASOUND GUIDED RIGHT BREAST CYST ASPIRATION

[Series 1: us breast aspiration · 0.06mm/px · 3 of 3 slices shown]
[im 1/3]
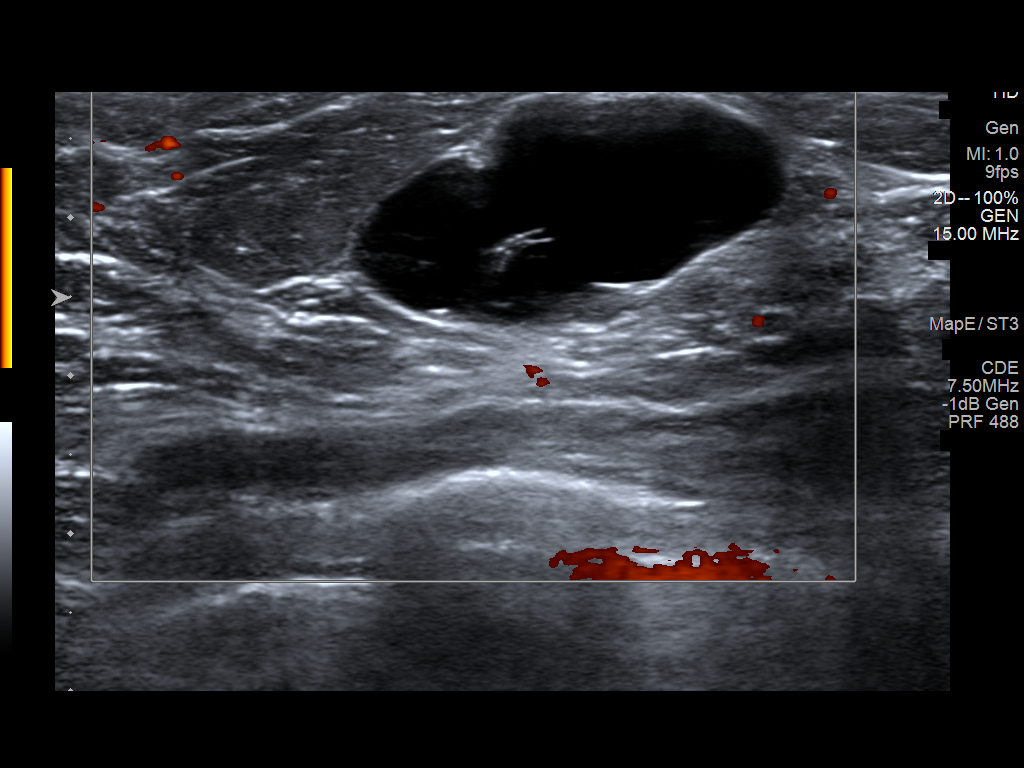
[im 2/3]
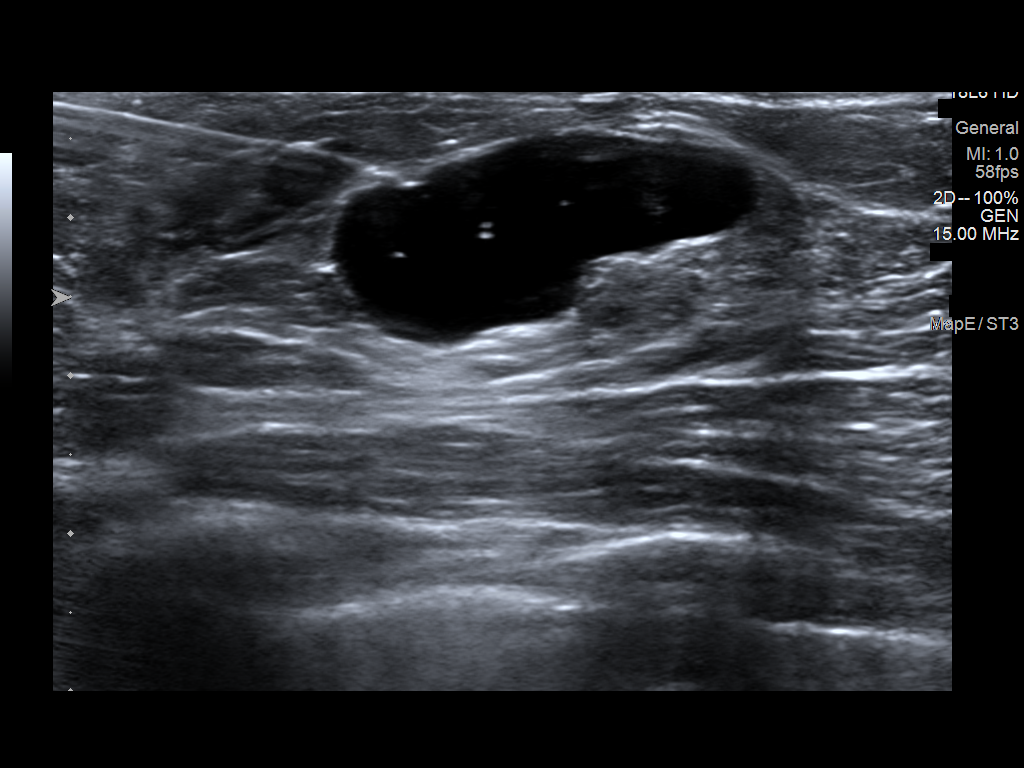
[im 3/3]
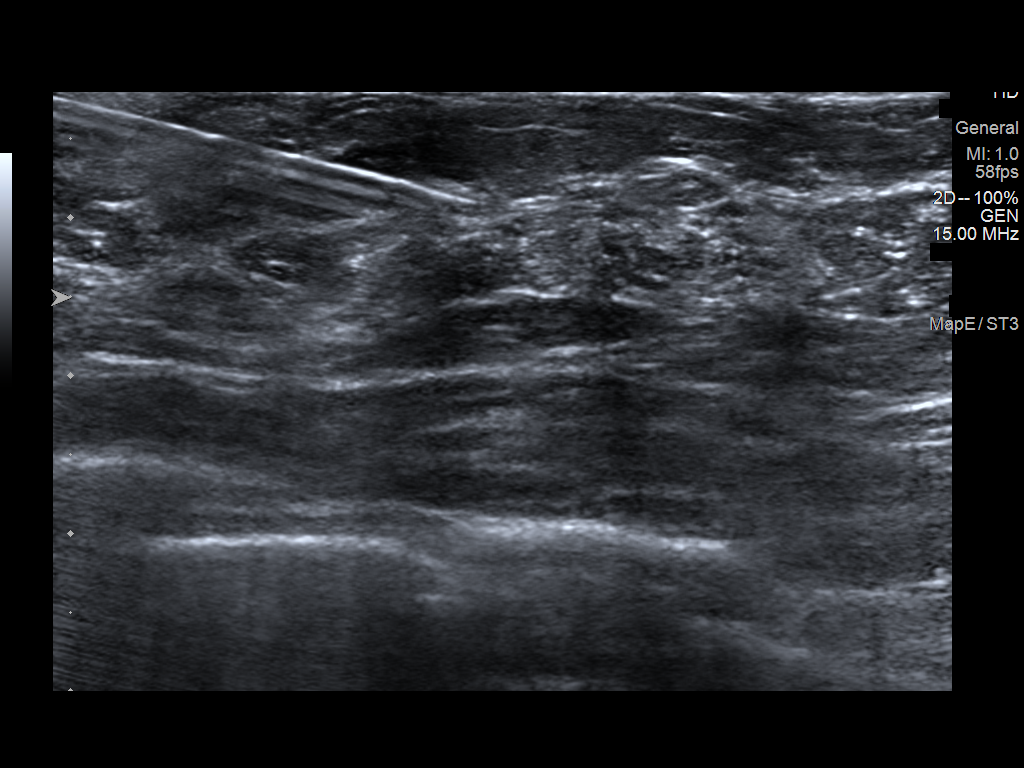

[3 of 3 positions shown; findings below may reference images not displayed]

PROCEDURE:
Using sterile technique, 1% lidocaine, under direct ultrasound
visualization, needle aspiration of a cyst at the 10 o'clock
position of the right breast was performed. The cyst was aspirated
to completion. Approximately 6 cc of clear fluid was obtained and
discarded.
IMPRESSION: Ultrasound-guided aspiration of a right breast cyst to completion.
No apparent complications.

RECOMMENDATIONS:
Annual screening mammography in 1 year.

## 2020-04-27 ENCOUNTER — Other Ambulatory Visit: Payer: Self-pay

## 2020-04-27 ENCOUNTER — Emergency Department (HOSPITAL_COMMUNITY)
Admission: EM | Admit: 2020-04-27 | Discharge: 2020-04-27 | Disposition: A | Discharge status: Home / Self Care | Payer: No Typology Code available for payment source | Attending: Emergency Medicine | Admitting: Emergency Medicine

## 2020-04-27 DIAGNOSIS — Z5321 Procedure and treatment not carried out due to patient leaving prior to being seen by health care provider: Secondary | ICD-10-CM | POA: Insufficient documentation

## 2020-04-27 DIAGNOSIS — E860 Dehydration: Secondary | ICD-10-CM | POA: Diagnosis not present

## 2020-04-27 DIAGNOSIS — R197 Diarrhea, unspecified: Secondary | ICD-10-CM | POA: Diagnosis present

## 2020-04-27 LAB — URINALYSIS, ROUTINE W REFLEX MICROSCOPIC
Bacteria, UA: NONE SEEN
Bilirubin Urine: NEGATIVE
Glucose, UA: NEGATIVE mg/dL
Hgb urine dipstick: NEGATIVE
Ketones, ur: NEGATIVE mg/dL
Leukocytes,Ua: NEGATIVE
Nitrite: NEGATIVE
Protein, ur: 30 mg/dL — AB
Specific Gravity, Urine: 1.021 (ref 1.005–1.030)
pH: 5 (ref 5.0–8.0)

## 2020-04-27 LAB — COMPREHENSIVE METABOLIC PANEL
ALT: 19 U/L (ref 0–44)
AST: 23 U/L (ref 15–41)
Albumin: 4.2 g/dL (ref 3.5–5.0)
Alkaline Phosphatase: 43 U/L (ref 38–126)
Anion gap: 10 (ref 5–15)
BUN: 7 mg/dL (ref 6–20)
CO2: 24 mmol/L (ref 22–32)
Calcium: 8.8 mg/dL — ABNORMAL LOW (ref 8.9–10.3)
Chloride: 103 mmol/L (ref 98–111)
Creatinine, Ser: 1.03 mg/dL — ABNORMAL HIGH (ref 0.44–1.00)
GFR calc Af Amer: >60 mL/min (ref >60)
GFR calc non Af Amer: >60 mL/min (ref >60)
Glucose, Bld: 104 mg/dL — ABNORMAL HIGH (ref 70–99)
Potassium: 3.2 mmol/L — ABNORMAL LOW (ref 3.5–5.1)
Sodium: 137 mmol/L (ref 135–145)
Total Bilirubin: 0.9 mg/dL (ref 0.3–1.2)
Total Protein: 7.3 g/dL (ref 6.5–8.1)

## 2020-04-27 LAB — CBC
HCT: 43.8 % (ref 36.0–46.0)
Hemoglobin: 14.8 g/dL (ref 12.0–15.0)
MCH: 30.1 pg (ref 26.0–34.0)
MCHC: 33.8 g/dL (ref 30.0–36.0)
MCV: 89.2 fL (ref 80.0–100.0)
Platelets: 164 10*3/uL (ref 150–400)
RBC: 4.91 MIL/uL (ref 3.87–5.11)
RDW: 13.6 % (ref 11.5–15.5)
WBC: 7.4 10*3/uL (ref 4.0–10.5)
nRBC: 0 % (ref 0.0–0.2)

## 2020-04-27 LAB — LIPASE, BLOOD: Lipase: 25 U/L (ref 11–51)

## 2020-04-27 LAB — I-STAT BETA HCG BLOOD, ED (MC, WL, AP ONLY): I-stat hCG, quantitative: <5 m[IU]/mL (ref <5)

## 2020-04-27 NOTE — ED Triage Notes (Signed)
Patient states she has been having diarrhea since Friday evening. No vomiting or nausea. States she feels dehydrated.

## 2020-04-27 NOTE — ED Notes (Signed)
Pt called for rooming x3! No answer! 

## 2020-07-24 ENCOUNTER — Other Ambulatory Visit: Payer: Self-pay | Admitting: Internal Medicine

## 2020-07-24 DIAGNOSIS — Z1231 Encounter for screening mammogram for malignant neoplasm of breast: Secondary | ICD-10-CM

## 2020-08-15 ENCOUNTER — Ambulatory Visit: Payer: No Typology Code available for payment source

## 2020-08-20 ENCOUNTER — Ambulatory Visit
Admission: RE | Admit: 2020-08-20 | Discharge: 2020-08-20 | Disposition: A | Discharge status: Home / Self Care | Payer: No Typology Code available for payment source | Source: Ambulatory Visit | Attending: Internal Medicine | Admitting: Internal Medicine

## 2020-08-20 ENCOUNTER — Other Ambulatory Visit: Payer: Self-pay

## 2020-08-20 ENCOUNTER — Ambulatory Visit: Payer: No Typology Code available for payment source

## 2020-08-20 DIAGNOSIS — Z1231 Encounter for screening mammogram for malignant neoplasm of breast: Secondary | ICD-10-CM

## 2020-09-09 ENCOUNTER — Other Ambulatory Visit: Payer: Self-pay | Admitting: Nurse Practitioner

## 2020-09-09 DIAGNOSIS — S4991XA Unspecified injury of right shoulder and upper arm, initial encounter: Secondary | ICD-10-CM

## 2020-09-25 ENCOUNTER — Ambulatory Visit: Payer: No Typology Code available for payment source

## 2020-09-28 ENCOUNTER — Other Ambulatory Visit: Payer: Self-pay

## 2020-09-28 ENCOUNTER — Ambulatory Visit
Admission: RE | Admit: 2020-09-28 | Discharge: 2020-09-28 | Disposition: A | Discharge status: Home / Self Care | Payer: Worker's Compensation | Source: Ambulatory Visit | Attending: Nurse Practitioner | Admitting: Nurse Practitioner

## 2020-09-28 DIAGNOSIS — S4991XA Unspecified injury of right shoulder and upper arm, initial encounter: Secondary | ICD-10-CM

## 2020-11-20 ENCOUNTER — Other Ambulatory Visit: Payer: Self-pay

## 2020-11-20 ENCOUNTER — Ambulatory Visit (HOSPITAL_BASED_OUTPATIENT_CLINIC_OR_DEPARTMENT_OTHER)
Admission: RE | Admit: 2020-11-20 | Discharge: 2020-11-20 | Disposition: A | Discharge status: Home / Self Care | Payer: No Typology Code available for payment source | Source: Ambulatory Visit | Attending: Internal Medicine | Admitting: Internal Medicine

## 2020-11-20 ENCOUNTER — Other Ambulatory Visit (HOSPITAL_BASED_OUTPATIENT_CLINIC_OR_DEPARTMENT_OTHER): Payer: Self-pay | Admitting: Emergency Medicine

## 2020-11-20 DIAGNOSIS — R52 Pain, unspecified: Secondary | ICD-10-CM | POA: Diagnosis present

## 2020-11-20 DIAGNOSIS — R059 Cough, unspecified: Secondary | ICD-10-CM | POA: Insufficient documentation

## 2021-04-06 ENCOUNTER — Other Ambulatory Visit: Payer: Self-pay | Admitting: Surgery

## 2021-04-06 ENCOUNTER — Other Ambulatory Visit (HOSPITAL_COMMUNITY): Payer: Self-pay | Admitting: Surgery

## 2021-04-08 ENCOUNTER — Encounter (HOSPITAL_COMMUNITY): Payer: Self-pay | Admitting: *Deleted

## 2021-04-14 ENCOUNTER — Ambulatory Visit (HOSPITAL_COMMUNITY)
Admission: RE | Admit: 2021-04-14 | Discharge: 2021-04-14 | Disposition: A | Discharge status: Home / Self Care | Payer: No Typology Code available for payment source | Source: Ambulatory Visit | Attending: Surgery | Admitting: Surgery

## 2021-04-14 ENCOUNTER — Other Ambulatory Visit: Payer: Self-pay

## 2021-07-29 ENCOUNTER — Institutional Professional Consult (permissible substitution): Payer: No Typology Code available for payment source | Admitting: Plastic Surgery

## 2022-03-02 ENCOUNTER — Other Ambulatory Visit: Payer: Self-pay

## 2022-03-02 ENCOUNTER — Emergency Department (HOSPITAL_COMMUNITY)
Admission: EM | Admit: 2022-03-02 | Discharge: 2022-03-02 | Disposition: A | Discharge status: Home / Self Care | Payer: No Typology Code available for payment source | Attending: Emergency Medicine | Admitting: Emergency Medicine

## 2022-03-02 ENCOUNTER — Encounter (HOSPITAL_COMMUNITY): Payer: Self-pay

## 2022-03-02 DIAGNOSIS — R55 Syncope and collapse: Secondary | ICD-10-CM | POA: Insufficient documentation

## 2022-03-02 DIAGNOSIS — Z859 Personal history of malignant neoplasm, unspecified: Secondary | ICD-10-CM | POA: Insufficient documentation

## 2022-03-02 DIAGNOSIS — R001 Bradycardia, unspecified: Secondary | ICD-10-CM | POA: Insufficient documentation

## 2022-03-02 LAB — URINALYSIS, ROUTINE W REFLEX MICROSCOPIC
Bilirubin Urine: NEGATIVE
Glucose, UA: NEGATIVE mg/dL
Hgb urine dipstick: NEGATIVE
Ketones, ur: NEGATIVE mg/dL
Leukocytes,Ua: NEGATIVE
Nitrite: NEGATIVE
Protein, ur: NEGATIVE mg/dL
Specific Gravity, Urine: 1.016 (ref 1.005–1.030)
pH: 7 (ref 5.0–8.0)

## 2022-03-02 LAB — CBC
HCT: 44.8 % (ref 36.0–46.0)
Hemoglobin: 15.3 g/dL — ABNORMAL HIGH (ref 12.0–15.0)
MCH: 31 pg (ref 26.0–34.0)
MCHC: 34.2 g/dL (ref 30.0–36.0)
MCV: 90.9 fL (ref 80.0–100.0)
Platelets: 210 10*3/uL (ref 150–400)
RBC: 4.93 MIL/uL (ref 3.87–5.11)
RDW: 14.1 % (ref 11.5–15.5)
WBC: 5 10*3/uL (ref 4.0–10.5)
nRBC: 0 % (ref 0.0–0.2)

## 2022-03-02 LAB — BASIC METABOLIC PANEL
Anion gap: 4 — ABNORMAL LOW (ref 5–15)
BUN: 9 mg/dL (ref 6–20)
CO2: 25 mmol/L (ref 22–32)
Calcium: 9.2 mg/dL (ref 8.9–10.3)
Chloride: 107 mmol/L (ref 98–111)
Creatinine, Ser: 0.96 mg/dL (ref 0.44–1.00)
GFR, Estimated: >60 mL/min (ref >60)
Glucose, Bld: 87 mg/dL (ref 70–99)
Potassium: 4.2 mmol/L (ref 3.5–5.1)
Sodium: 136 mmol/L (ref 135–145)

## 2022-03-02 LAB — I-STAT BETA HCG BLOOD, ED (MC, WL, AP ONLY): I-stat hCG, quantitative: <5 m[IU]/mL (ref <5)

## 2022-03-02 LAB — CBG MONITORING, ED: Glucose-Capillary: 91 mg/dL (ref 70–99)

## 2022-03-02 MED ORDER — SODIUM CHLORIDE 0.9 % IV BOLUS: 1000.0000 mL | Freq: Once | INTRAVENOUS | Status: DC

## 2022-03-02 NOTE — ED Triage Notes (Signed)
Patient reports that she began having fatigue 5 days ago. ?Patient was seen at a [facility] ED on 02/27/22 and states she had an abnormal EKG at that time. ?Patient states her BP this AM was 90/57 at home. BP in triage 114-67. ? ?Patient also c/o feeling near syncope, nauseated, nd having dizziness x 4 days. ?

## 2022-03-02 NOTE — ED Provider Notes (Signed)
?[facility] DEPT ?Provider Note ? ? ?CSN: [id number] ?Arrival date & time: 03/02/22  1234 ? ?  ? ?History ? ?Chief Complaint  ?Patient presents with  ? Hypotension  ? Nausea  ? Dizziness  ? Near Syncope  ? ? ?Wendy George is a 51 y.o. female. ? ?HPI ? ?  ? ?51 year old female comes in with chief complaint of low blood pressure, nausea, dizziness and near fainting. ? ?She has no significant medical history.  She is status post gastric surgery from years ago and is not taking any BP medications, hormones and has no substance use disorder. ? ?Patient states that she has not been feeling great for the last 5 days.  She started feeling episodes of dizziness starting the last 3 days.  She had gone to ER in [place].  She had a CT scan and some basic work-up done, which was reassuring.  She was discharged from there.  She called her PCP, she has an appointment for next Monday.  However, she continues to have episodes of dizziness when she is ambulating, and on occasion she has felt like she might faint.  She has not had any palpitations. ? ?Pt has no hx of PE, DVT and denies any exogenous hormone (testosterone / estrogen) use, long distance travels or surgery in the past 6 weeks, active cancer, recent immobilization. ? ?Dizziness is described as lightheadedness.  There is no associated vision loss, one-sided numbness, weakness, tingling. ? ? ?Home Medications ?Prior to Admission medications   ?Medication Sig Start Date End Date Taking? Authorizing Provider  ?cloNIDine (CATAPRES) 0.1 MG tablet Take 0.1 mg by mouth as needed (if SBP greater then 160/90).     [provider]  ?ondansetron (ZOFRAN-ODT) 4 MG disintegrating tablet Take 1 tablet (4 mg total) by mouth every 6 (six) hours as needed for nausea or vomiting. 09/19/18   Clovis Riley, MD  ?pantoprazole (PROTONIX) 40 MG tablet Take 1 tablet (40 mg total) by mouth daily. 09/19/18   Clovis Riley, MD  ?traMADol (ULTRAM)  50 MG tablet Take 1 tablet (50 mg total) by mouth every 6 (six) hours as needed (pain). 09/19/18   Clovis Riley, MD  ?traZODone (DESYREL) 100 MG tablet Take 100 mg by mouth at bedtime as needed for sleep. TAKES 50 MG OF 100 MG TABLET    [provider]  ?   ? ?Allergies    ?Patient has no known allergies.   ? ?Review of Systems   ?Review of Systems ? ?Physical Exam ?Updated Vital Signs ?BP 104/65   Pulse (!) 52   Temp 98 ?F (36.7 ?C) (Oral)   Resp (!) 22   Ht 6\' 1"  (1.854 m)   Wt 94.8 kg   LMP 01/20/2022 (Approximate)   SpO2 98%   BMI 27.57 kg/m?  ?Physical Exam ?Vitals and nursing note reviewed.  ?Constitutional:   ?   Appearance: She is well-developed.  ?HENT:  ?   Head: Atraumatic.  ?Eyes:  ?   Extraocular Movements: Extraocular movements intact.  ?   Pupils: Pupils are equal, round, and reactive to light.  ?Cardiovascular:  ?   Rate and Rhythm: Normal rate.  ?Pulmonary:  ?   Effort: Pulmonary effort is normal.  ?Musculoskeletal:  ?   Cervical back: Normal range of motion and neck supple.  ?Skin: ?   General: Skin is warm and dry.  ?Neurological:  ?   Mental Status: She is alert and oriented to person,  place, and time.  ?   Cranial Nerves: No cranial nerve deficit.  ?   Sensory: No sensory deficit.  ?   Motor: No weakness.  ?   Coordination: Coordination normal.  ?   Gait: Gait normal.  ? ? ?ED Results / Procedures / Treatments   ?Labs ?(all labs ordered are listed, but only abnormal results are displayed) ?Labs Reviewed  ?BASIC METABOLIC PANEL - Abnormal; Notable for the following components:  ?    Result Value  ? Anion gap 4 (*)   ? All other components within normal limits  ?CBC - Abnormal; Notable for the following components:  ? Hemoglobin 15.3 (*)   ? All other components within normal limits  ?URINALYSIS, ROUTINE W REFLEX MICROSCOPIC  ?CBG MONITORING, ED  ?I-STAT BETA HCG BLOOD, ED ([facility], [facility], [facility] ONLY)  ? ? ?EKG ?EKG Interpretation ? ?Date/Time:  Tuesday Mar 02 2022 12:46:54  EDT ?Ventricular Rate:  51 ?PR Interval:  146 ?QRS Duration: 101 ?QT Interval:  447 ?QTC Calculation: 412 ?R Axis:   87 ?Text Interpretation: Sinus rhythm Left atrial enlargement No acute changes No significant change since last tracing Confirmed by Varney Biles [phone]) on 03/02/2022 3:21:42 PM ? ?Radiology ?No results found. ? ?Procedures ?Procedures  ? ? ?Medications Ordered in ED ?Medications  ?sodium chloride 0.9 % bolus 1,000 mL (1,000 mLs Intravenous Patient Refused/Not Given 03/02/22 1431)  ? ? ?ED Course/ Medical Decision Making/ A&P ?  ?                        ?Medical Decision Making ?Amount and/or Complexity of Data Reviewed ?Labs: ordered. ? ? ?This patient presents to the ED with chief complaint(s) of dizziness, near fainting, low blood pressure with pertinent past medical history of gastric procedure only. ? ?Patient's family history, social history is unremarkable from the perspective of her chief complaint. ? ?The differential diagnosis includes : ?Dizziness secondary to postural hypotension, orthostatic hypotension, severe electrolyte abnormality, low perfusion secondary to bradycardia, bradycardia dysrhythmia, tachydysrhythmia. ? ?Low suspicion for PE.  Patient's neurologic exam is nonfocal.  No nystagmus, no dysmetria and patient had no ataxia.  Doubt this is a central process. ? ?Patient is EKG shows heart rate of 52.  While at [facility], she had heart rate of 49.  Patient is concerned that her symptoms are because of her bradycardia.  She normally does not have low heart rate.  She denies any tick bites or tick borne illnesses ? ? ?Additional history obtained: ?Records reviewed Care Everywhere/External Records ? ?Independent labs interpretation:  ?Patient has no anemia.  She has no AKI or electrolyte abnormality. ?I interpreted the CT brain results from outside facility.  No repeat CT is indicated. ? ? ?Final Clinical Impression(s) / ED Diagnoses ?Final diagnoses:  ?Bradycardia  ?Near syncope   ? ? ?Rx / DC Orders ?ED Discharge Orders   ? ?      Ordered  ?  Ambulatory referral to Cardiology       ?Comments: If you have not heard from the Cardiology office within the next 72 hours please call [phone].  ? 03/02/22 1616  ? ?  ?  ? ?  ? ? ?  ?Varney Biles, MD ?03/02/22 1623 ? ?

## 2022-03-02 NOTE — Discharge Instructions (Signed)
You are seen in the ER for dizziness.  You indicated that your blood pressure was low at home, and in the ER we have noticed that your heart rate is in the low 50s. ? ?It is possible that your symptoms could be because of low heart rate. ?Fortunately, there is no concerning arrhythmia.  We have put in a referral for cardiology service. ? ?Return to the ER if you have a fainting spell ?

## 2022-03-03 ENCOUNTER — Ambulatory Visit (INDEPENDENT_AMBULATORY_CARE_PROVIDER_SITE_OTHER): Payer: Self-pay | Admitting: Internal Medicine

## 2022-03-03 VITALS — BP 120/76 | HR 64 | Ht 73.0 in | Wt 211.4 lb

## 2022-03-03 DIAGNOSIS — R42 Dizziness and giddiness: Secondary | ICD-10-CM

## 2022-03-03 NOTE — Progress Notes (Signed)
?Cardiology Office Note:   ? ?Date:  03/03/2022  ? ?ID:  Drenda Freeze, DOB May 17, 1971, MRN [id number] ? ?PCP:  Galvin Proffer, MD ?  ?[facility] Providers ?Cardiologist:  None    ? ?Referring MD: Galvin Proffer, MD  ? ?No chief complaint on file. ?Nausea/Dizziness ? ?History of Present Illness:   ? ?Wendy George is a 51 y.o. female with a hx of anxiety, HTN, sleep apnea on CPAP, gastric sleeve 2019,  referral for acute episode of nausea and dizziness from the ED ? ?Was in the ED yesterday. Noted to have nausea and dizziness and near syncopal. She is s/p gastric surgery. HR was 51. Prior to this she felt very fatigued. She took her BP and it was low. She noted 70/50. She got fluids. No fevers or chills. No prior incidence. Maternal grandmother had CHF. No chest pressure or persistent SOB. ? ?EKG showed HR 51 bpm. ? ?Past Medical History:  ?Diagnosis Date  ? Anxiety   ? Chronic lower back pain   ? Chronic pain in right shoulder   ? Depression   ? Headache   ? HX MIGRAINES YRS AGO  ? Hypertension   ? Sleep apnea   ? CPAP WITH AUTO SET  ? Urinary urgency   ? ? ?Past Surgical History:  ?Procedure Laterality Date  ? LAPAROSCOPIC GASTRIC SLEEVE RESECTION N/A 09/18/2018  ? Procedure: LAPAROSCOPIC GASTRIC SLEEVE RESECTION, UPPER ENDO, ERAS Pathway;  Surgeon: Berna Bue, MD;  Location: [facility];  Service: General;  Laterality: N/A;  ? ROTATOR CUFF REPAIR Right 11/10/2015  ? TUBAL LIGATION    ? tubal reversal    ? ? ?Current Medications: ?No outpatient medications have been marked as taking for the 03/03/22 encounter (Appointment) with Maisie Fus, MD.  ?  ? ?Allergies:   Patient has no known allergies.  ? ?Social History  ? ?Socioeconomic History  ? Marital status: Legally Separated  ?  Spouse name: Not on file  ? Number of children: Not on file  ? Years of education: Not on file  ? Highest education level: Not on file  ?Occupational History  ? Not on file  ?Tobacco Use  ? Smoking status: Every Day  ?   Packs/day: 0.25  ?  Years: 25.00  ?  Pack years: 6.25  ?  Types: Cigarettes  ?  Last attempt to quit: 03/11/2018  ?  Years since quitting: 3.9  ? Smokeless tobacco: Never  ? Tobacco comments:  ?  1-2 cigarettes/day/ quit may 2019  ?Vaping Use  ? Vaping Use: Former  ?Substance and Sexual Activity  ? Alcohol use: Yes  ?  Comment: socially  ? Drug use: No  ? Sexual activity: Yes  ?  Birth control/protection: Condom  ?Other Topics Concern  ? Not on file  ?Social History Narrative  ? Not on file  ? ?Social Determinants of Health  ? ?Financial Resource Strain: Not on file  ?Food Insecurity: Not on file  ?Transportation Needs: Not on file  ?Physical Activity: Not on file  ?Stress: Not on file  ?Social Connections: Not on file  ?  ? ?Family History: ?The patient's family history includes Colon cancer in her maternal aunt and paternal aunt; Diabetes in her father and paternal aunt; Hypertension in her father and paternal aunt; Lung cancer in her paternal grandfather; Prostate cancer in her maternal grandfather. ? ?ROS:   ?Please see the history of present illness.    ? All other  systems reviewed and are negative. ? ?EKGs/Labs/Other Studies Reviewed:   ? ?The following studies were reviewed today: ? ? ?Recent Labs: ?03/02/2022: BUN 9; Creatinine, Ser 0.96; Hemoglobin 15.3; Platelets 210; Potassium 4.2; Sodium 136  ?Recent Lipid Panel ?No results found for: CHOL, TRIG, HDL, CHOLHDL, VLDL, LDLCALC, LDLDIRECT ? ? ?Risk Assessment/Calculations:   ?  ? ?    ? ?Physical Exam:   ? ?VS:   ? ?Vitals:  ? 03/03/22 1615  ?Pulse: 64  ?SpO2: 98%  ? ?  ? ?Wt Readings from Last 3 Encounters:  ?03/02/22 209 lb (94.8 kg)  ?11/27/18 268 lb 12.8 oz (121.9 kg)  ?10/04/18 292 lb (132.5 kg)  ?  ? ?GEN:  Well nourished, well developed in no acute distress ?HEENT: Normal ?NECK: No JVD; No carotid bruits ?LYMPHATICS: No lymphadenopathy ?CARDIAC: RRR, no murmurs, rubs, gallops ?RESPIRATORY:  Clear to auscultation without rales, wheezing or rhonchi   ?ABDOMEN: Soft, non-tender, non-distended ?MUSCULOSKELETAL:  No edema; No deformity  ?SKIN: Warm and dry ?NEUROLOGIC:  Alert and oriented x 3 ?PSYCHIATRIC:  Normal affect  ? ?ASSESSMENT:   ?Dizziness: symptoms resolved. Unlikely rhythm related. No significant sinus bradycardia. Hypotension was transient. Discussed with her that if her hypotension is persistent she can be screened for an endocrine disorder (eg TSH, AM cortisol etc.) ? ?PLAN:   ? ?In order of problems listed above: ? ?No further cardiac work up ? ?   ? ?   ? ? ?Medication Adjustments/Labs and Tests Ordered: ?Current medicines are reviewed at length with the patient today.  Concerns regarding medicines are outlined above.  ?No orders of the defined types were placed in this encounter. ? ?No orders of the defined types were placed in this encounter. ? ? ?There are no Patient Instructions on file for this visit.  ? ?Signed, ?Maisie Fus, MD  ?03/03/2022 10:00 AM    ?[facility] ?

## 2022-03-03 NOTE — Patient Instructions (Signed)
Medication Instructions:  ?Your physician recommends that you continue on your current medications as directed. Please refer to the Current Medication list given to you today. ? ?*If you need a refill on your cardiac medications before your next appointment, please call your pharmacy* ? ?Follow-Up: ?At [facility], you and your health needs are our priority.  As part of our continuing mission to provide you with exceptional heart care, we have created designated Provider Care Teams.  These Care Teams include your primary Cardiologist (physician) and Advanced Practice Providers (APPs -  Physician Assistants and Nurse Practitioners) who all work together to provide you with the care you need, when you need it. ? ?We recommend signing up for the patient portal called "MyChart".  Sign up information is provided on this After Visit Summary.  MyChart is used to connect with patients for Virtual Visits (Telemedicine).  Patients are able to view lab/test results, encounter notes, upcoming appointments, etc.  Non-urgent messages can be sent to your provider as well.   ?To learn more about what you can do with MyChart, go to [email or web address].   ? ?Your next appointment:   ?AS NEEDED with Dr. Branch  ? ? ?Important Information About Sugar ? ? ? ? ? ?  ?

## 2023-05-03 ENCOUNTER — Encounter (HOSPITAL_COMMUNITY): Payer: Self-pay | Admitting: *Deleted

## 2024-04-19 ENCOUNTER — Encounter (HOSPITAL_COMMUNITY): Payer: Self-pay | Admitting: *Deleted

## 2024-05-18 ENCOUNTER — Ambulatory Visit
Admission: EM | Admit: 2024-05-18 | Discharge: 2024-05-18 | Disposition: A | Discharge status: Home / Self Care | Payer: Worker's Compensation | Attending: Internal Medicine | Admitting: Internal Medicine

## 2024-05-18 ENCOUNTER — Encounter: Payer: Self-pay | Admitting: Emergency Medicine

## 2024-05-18 DIAGNOSIS — S46812A Strain of other muscles, fascia and tendons at shoulder and upper arm level, left arm, initial encounter: Secondary | ICD-10-CM | POA: Diagnosis not present

## 2024-05-18 MED ORDER — ACETAMINOPHEN 325 MG PO TABS
975.0000 mg | ORAL_TABLET | Freq: Once | ORAL | Status: AC
  Administered 2024-05-18: 975 mg via ORAL

## 2024-05-18 MED ORDER — BACLOFEN 10 MG PO TABS: 10.0000 mg | ORAL_TABLET | Freq: Three times a day (TID) | ORAL | 0 refills | Status: AC

## 2024-05-18 MED ORDER — KETOROLAC TROMETHAMINE 30 MG/ML IJ SOLN
30.0000 mg | Freq: Once | INTRAMUSCULAR | Status: AC
  Administered 2024-05-18: 30 mg via INTRAMUSCULAR

## 2024-05-18 NOTE — ED Provider Notes (Addendum)
 [facility]    CSN: [id number] Arrival date & time: 05/18/24  1800      History   Chief Complaint Chief Complaint  Patient presents with   Arm Injury    HPI Wendy George is a 53 y.o. female.   Wendy George is a 53 y.o. female presenting for chief complaint of Arm Injury that happened while she was at work on May 15, 2024 (2 days ago). 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 The client was in a [employer], [employer] lift stopped working due to Well[employer] geologist, patient started to slide out of the [employer] chair causing 5 staff members around them to have to slowly assist them to the ground.  Our patient had to bear a lot of weight in her left arm to help assist the patient to the ground as she was standing at his left backside/head.  She began to have pain in the left arm after helping the client to the ground and pain has worsened over the last 24 to 48 hours since injury.  No blunt trauma or injury to the left arm, no recent falls, no radicular pain to the left arm/hand, and no chest pain.  Pain improved significantly with heat, returns after heating pad turns off.  She is having difficulty with range of motion activity at the left shoulder due to pain.  Denies previous injury to the left shoulder. History of gastric sleeve, however she has been taking ibuprofen  800 mg with some relief.  She understands that this is not to be taken frequently, she only takes ibuprofen /NSAIDs when pain is not responding well to use of Tylenol .  Denies acid reflux symptoms.   Arm Injury   Past Medical History:  Diagnosis Date   Anxiety    Chronic lower back pain    Chronic pain in right shoulder    Depression    Headache    HX MIGRAINES YRS AGO   Hypertension    Sleep apnea    CPAP WITH AUTO SET   Urinary urgency     Patient Active Problem List   Diagnosis Date Noted   Morbid obesity (HCC)  09/18/2018   Varicose veins of bilateral lower extremities with other complications 01/24/2017   Spider veins of both lower extremities 01/24/2017    Past Surgical History:  Procedure Laterality Date   LAPAROSCOPIC GASTRIC SLEEVE RESECTION N/A 09/18/2018   Procedure: LAPAROSCOPIC GASTRIC SLEEVE RESECTION, UPPER ENDO, ERAS Pathway;  Surgeon: Signe Mitzie LABOR, MD;  Location: [facility];  Service: General;  Laterality: N/A;   ROTATOR CUFF REPAIR Right 11/10/2015   TUBAL LIGATION     tubal reversal      OB History   No obstetric history on file.      Home Medications    Prior to Admission medications   Medication Sig Start Date End Date Taking? Authorizing Provider  baclofen (LIORESAL) 10 MG tablet Take 1 tablet (10 mg total) by mouth 3 (three) times daily. 05/18/24  Yes Enedelia Dorna HERO, FNP  albuterol (PROVENTIL) (2.5 MG/3ML) 0.083% nebulizer solution 3 ml as needed 11/20/20   [provider]  ALPRAZolam (XANAX) 0.5 MG tablet Take 0.5 mg by mouth every 6 (six) hours as needed for anxiety.    [provider]  citalopram (CELEXA) 20 MG tablet Take 20 mg by mouth at bedtime.    [provider]  pantoprazole  (PROTONIX ) 40 MG tablet Take 1 tablet (40 mg total) by mouth daily. 09/19/18   Signe Mitzie LABOR, MD  pregabalin (LYRICA) 150 MG capsule Take 150 mg by mouth as needed.    [provider]  SUMAtriptan (IMITREX) 100 MG tablet Take 100 mg by mouth every 2 (two) hours as needed for migraine. May repeat in 2 hours if headache persists or recurs.    [provider]  topiramate (TOPAMAX) 100 MG tablet Take 100 mg by mouth at bedtime.    [provider]    Family History Family History  Problem Relation Age of Onset   Diabetes Father    Hypertension Father    Colon cancer Maternal Aunt    Colon cancer Paternal Aunt    Diabetes Paternal Aunt    Hypertension Paternal Aunt    Prostate cancer Maternal Grandfather    Lung cancer Paternal  Grandfather     Social History Social History   Tobacco Use   Smoking status: Every Day    Current packs/day: 0.00    Average packs/day: 0.3 packs/day for 25.0 years (6.3 ttl pk-yrs)    Types: Cigarettes    Start date: 03/11/1993    Last attempt to quit: 03/11/2018    Years since quitting: 6.1   Smokeless tobacco: Never   Tobacco comments:    1-2 cigarettes/day/ quit may 2019  Vaping Use   Vaping status: Former  Substance Use Topics   Alcohol use: Yes    Comment: socially   Drug use: No     Allergies   Patient has no known allergies.   Review of Systems Review of Systems Per HPI  Physical Exam Triage Vital Signs ED Triage Vitals  Encounter Vitals Group     BP 05/18/24 1842 117/75     Girls Systolic BP Percentile --      Girls Diastolic BP Percentile --      Boys Systolic BP Percentile --      Boys Diastolic BP Percentile --      Pulse Rate 05/18/24 1842 64     Resp 05/18/24 1842 16     Temp 05/18/24 1842 97.8 F (36.6 C)     Temp Source 05/18/24 1842 Oral     SpO2 05/18/24 1842 96 %     Weight --      Height --      Head Circumference --      Peak Flow --      Pain Score 05/18/24 1917 7     Pain Loc --      Pain Education --      Exclude from Growth Chart --    No data found.  Updated Vital Signs BP 117/75 (BP Location: Right Arm)   Pulse 64   Temp 97.8 F (36.6 C) (Oral)   Resp 16   SpO2 96%   Visual Acuity Right Eye Distance:   Left Eye Distance:   Bilateral Distance:    Right Eye Near:   Left Eye Near:    Bilateral Near:     Physical Exam Vitals and nursing note reviewed.  Constitutional:      Appearance: She is not ill-appearing or toxic-appearing.  HENT:     Head: Normocephalic and atraumatic.     Right Ear: Hearing and external ear normal.     Left Ear: Hearing and external ear normal.     Nose: Nose normal.     Mouth/Throat:     Lips: Pink.  Eyes:     General: Lids are normal. Vision grossly intact. Gaze aligned  appropriately.     Extraocular Movements: Extraocular movements intact.     Conjunctiva/sclera: Conjunctivae normal.  Pulmonary:     Effort: Pulmonary effort is normal.  Musculoskeletal:     Right shoulder: Normal.     Left shoulder: Tenderness (Diffuse tenderness to palpation over the left shoulder joint) present. No laceration or crepitus. Decreased range of motion (Decreased ROM of the left shoulder secondary to pain.  She is able to flex the left arm at the shoulder joint laterally to 90 degrees until pain starts to the left deltoid.  Forward flexion is additionally decreased.). Normal strength. Normal pulse (+2 left brachial pulse).     Left upper arm: Tenderness (Tender to palpation over the left deltoid muscle) present. No swelling, edema, deformity or lacerations.     Cervical back: Full passive range of motion without pain, normal range of motion and neck supple. No pain with movement, spinous process tenderness or muscular tenderness.     Comments: 5/5 strength against resistance with abduction and abduction of the left arm at the shoulder joint.  Lymphadenopathy:     Cervical: No cervical adenopathy.  Skin:    General: Skin is warm and dry.     Capillary Refill: Capillary refill takes less than 2 seconds.     Findings: No rash.  Neurological:     General: No focal deficit present.     Mental Status: She is alert and oriented to person, place, and time. Mental status is at baseline.     Cranial Nerves: No dysarthria or facial asymmetry.  Psychiatric:        Mood and Affect: Mood normal.        Speech: Speech normal.        Behavior: Behavior normal.        Thought Content: Thought content normal.        Judgment: Judgment normal.      UC Treatments / Results  Labs (all labs ordered are listed, but only abnormal results are displayed) Labs Reviewed - No data to display  EKG   Radiology No results found.  Procedures Procedures (including critical care  time)  Medications Ordered in UC Medications  ketorolac  (TORADOL ) 30 MG/ML injection 30 mg (has no administration in time range)  acetaminophen  (TYLENOL ) tablet 975 mg (has no administration in time range)    Initial Impression / Assessment and Plan / UC Course  I have reviewed the triage vital signs and the nursing notes.  Pertinent labs & imaging results that were available during my care of the patient were reviewed by me and considered in my medical decision making (see chart for details).   1.  Strain of left deltoid muscle Evaluation suggests pain is musculoskeletal in nature. Will manage this with rest, gentle ROM exercises, heat therapy, Tylenol  as needed for pain, and as needed use of muscle relaxer. Drowsiness precautions discussed regarding muscle relaxer use. Ketorolac  30 mg IM and Tylenol  975 mg p.o. given in clinic (no NSAIDs for 24 hours).  Last renal function available on file in 2023 is intact.  Patient denies history of kidney problems.  Last dose of ibuprofen  was greater than 8 hours ago. Imaging: no indication for imaging based on stable musculoskeletal exam findings, atraumatic mechanism of injury.  Low suspicion for bony dislocation/acute bony abnormality.  She may follow-up with [facility] occupational health or medicine.  Work note given.  Counseled  patient on potential for adverse effects with medications prescribed/recommended today, strict ER and return-to-clinic precautions discussed, patient verbalized understanding.    Final Clinical Impressions(s) / UC Diagnoses   Final diagnoses:  Strain of left deltoid muscle, initial encounter     Discharge Instructions      Your pain is likely due to a muscle strain which will improve on its own with time.   - You may take over the counter tylenol  1,000mg  every 6 hours as need to help with pain.  - If you were given a shot of pain medicine in the clinic today, you may start taking ibuprofen /other NSAIDs in  12-24 hours. - You may also take the prescribed muscle relaxer as directed as needed for muscle aches/spasm.  Do not take this medication and drive or drink alcohol as it can make you sleepy.  Mainly use this medicine at nighttime as needed. - Apply heat 20 minutes on then 20 minutes off and perform gentle range of motion exercises to the area of greatest pain to prevent muscle stiffness and provide further pain relief.   If you develop any new or worsening symptoms or if your symptoms do not start to improve, please return here or follow-up with your primary care provider. If your symptoms are severe, please go to the emergency room.     ED Prescriptions     Medication Sig Dispense Auth. Provider   baclofen (LIORESAL) 10 MG tablet Take 1 tablet (10 mg total) by mouth 3 (three) times daily. 30 each Enedelia Dorna HERO, FNP      PDMP not reviewed this encounter.   Enedelia Dorna HERO, FNP 05/18/24 1939    Enedelia Dorna HERO, FNP 05/18/24 1940

## 2024-05-18 NOTE — Discharge Instructions (Addendum)
 Your pain is likely due to a muscle strain which will improve on its own with time.   - You may take over the counter tylenol  1,000mg  every 6 hours as need to help with pain.  - If you were given a shot of pain medicine in the clinic today, you may start taking ibuprofen /other NSAIDs in 12-24 hours. - You may also take the prescribed muscle relaxer as directed as needed for muscle aches/spasm.  Do not take this medication and drive or drink alcohol as it can make you sleepy.  Mainly use this medicine at nighttime as needed. - Apply heat 20 minutes on then 20 minutes off and perform gentle range of motion exercises to the area of greatest pain to prevent muscle stiffness and provide further pain relief.   If you develop any new or worsening symptoms or if your symptoms do not start to improve, please return here or follow-up with your primary care provider. If your symptoms are severe, please go to the emergency room.

## 2024-05-18 NOTE — ED Triage Notes (Signed)
 Pt works at nursing facility. 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 She c/o left shoulder/ arm pain. States she has not been able to sleep on left side and ibuprofen  is not helping pain.   Pt preformed oral drug screen for workman's comp

## 2024-05-20 ENCOUNTER — Encounter: Payer: Self-pay | Admitting: Emergency Medicine

## 2024-05-20 ENCOUNTER — Ambulatory Visit
Admission: EM | Admit: 2024-05-20 | Discharge: 2024-05-20 | Disposition: A | Discharge status: Home / Self Care | Payer: Worker's Compensation | Attending: Emergency Medicine | Admitting: Emergency Medicine

## 2024-05-20 ENCOUNTER — Ambulatory Visit (INDEPENDENT_AMBULATORY_CARE_PROVIDER_SITE_OTHER): Payer: Self-pay | Admitting: Radiology

## 2024-05-20 DIAGNOSIS — M7582 Other shoulder lesions, left shoulder: Secondary | ICD-10-CM | POA: Diagnosis not present

## 2024-05-20 DIAGNOSIS — M25512 Pain in left shoulder: Secondary | ICD-10-CM

## 2024-05-20 MED ORDER — DEXAMETHASONE SODIUM PHOSPHATE 10 MG/ML IJ SOLN
10.0000 mg | Freq: Once | INTRAMUSCULAR | Status: AC
  Administered 2024-05-20: 10 mg via INTRAMUSCULAR

## 2024-05-20 NOTE — ED Triage Notes (Addendum)
 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 C/o left shoulder pain. She was given Toradol  shot in office and muscle relaxer prescription. States the muscle relaxer is not helped.   She took lyrica, Tylenol  and muscle relaxer around 8am

## 2024-05-20 NOTE — Discharge Instructions (Signed)
 Your x-ray showed no acute bony abnormality, did show evidence of chronic rotator cuff disease.  This may be exacerbated by your recent work injury.  The intramuscular steroid should work to help reduce inflammation.  For continued symptoms please follow-up with occupational health and an orthopedic.  Using topical Voltaren gel may be more effective than the muscle relaxer, can try lidocaine  patches as well.  Return to clinic for new or urgent symptoms.

## 2024-05-20 NOTE — ED Provider Notes (Signed)
 [facility]    CSN: [id number] Arrival date & time: 05/20/24  1235      History   Chief Complaint No chief complaint on file.   HPI Wendy George is a 53 y.o. female.   Patient presents to the clinic over concern of ongoing left shoulder pain after an injury at work where she helped to lift a heavy patient.  Was seen for same injury 2 days prior in clinic, given IM Toradol  and baclofen .  Patient has been taking the muscle relaxer without any improvement in her pain.  Difficulty getting dressed, is unable to put her bra on.  Difficulty extending her arm beyond 90 degrees due to pain.  Has not yet followed up with occupational medicine, as it is still the weekend.  History of rotator cuff repair on the right shoulder, reports rotator cuff pain to the left shoulder.  History of gastric sleeve.  The history is provided by the patient and medical records.    Past Medical History:  Diagnosis Date   Anxiety    Chronic lower back pain    Chronic pain in right shoulder    Depression    Headache    HX MIGRAINES YRS AGO   Hypertension    Sleep apnea    CPAP WITH AUTO SET   Urinary urgency     Patient Active Problem List   Diagnosis Date Noted   Morbid obesity (HCC) 09/18/2018   Varicose veins of bilateral lower extremities with other complications 01/24/2017   Spider veins of both lower extremities 01/24/2017    Past Surgical History:  Procedure Laterality Date   LAPAROSCOPIC GASTRIC SLEEVE RESECTION N/A 09/18/2018   Procedure: LAPAROSCOPIC GASTRIC SLEEVE RESECTION, UPPER ENDO, ERAS Pathway;  Surgeon: Signe Mitzie LABOR, MD;  Location: [facility];  Service: General;  Laterality: N/A;   ROTATOR CUFF REPAIR Right 11/10/2015   TUBAL LIGATION     tubal reversal      OB History   No obstetric history on file.      Home Medications    Prior to Admission medications   Medication Sig Start Date End Date Taking? Authorizing Provider  albuterol (PROVENTIL)  (2.5 MG/3ML) 0.083% nebulizer solution 3 ml as needed 11/20/20   [provider]  ALPRAZolam (XANAX) 0.5 MG tablet Take 0.5 mg by mouth every 6 (six) hours as needed for anxiety.    [provider]  baclofen  (LIORESAL ) 10 MG tablet Take 1 tablet (10 mg total) by mouth 3 (three) times daily. 05/18/24   Enedelia Dorna HERO, FNP  citalopram (CELEXA) 20 MG tablet Take 20 mg by mouth at bedtime.    [provider]  pantoprazole  (PROTONIX ) 40 MG tablet Take 1 tablet (40 mg total) by mouth daily. 09/19/18   Signe Mitzie LABOR, MD  pregabalin (LYRICA) 150 MG capsule Take 150 mg by mouth as needed.    [provider]  SUMAtriptan (IMITREX) 100 MG tablet Take 100 mg by mouth every 2 (two) hours as needed for migraine. May repeat in 2 hours if headache persists or recurs.    [provider]  topiramate (TOPAMAX) 100 MG tablet Take 100 mg by mouth at bedtime.    [provider]    Family History Family History  Problem Relation Age of Onset   Diabetes Father    Hypertension Father    Colon cancer Maternal Aunt    Colon cancer Paternal Aunt    Diabetes Paternal Aunt    Hypertension  Paternal Aunt    Prostate cancer Maternal Grandfather    Lung cancer Paternal Grandfather     Social History Social History   Tobacco Use   Smoking status: Every Day    Current packs/day: 0.00    Average packs/day: 0.3 packs/day for 25.0 years (6.3 ttl pk-yrs)    Types: Cigarettes    Start date: 03/11/1993    Last attempt to quit: 03/11/2018    Years since quitting: 6.1   Smokeless tobacco: Never   Tobacco comments:    1-2 cigarettes/day/ quit may 2019  Vaping Use   Vaping status: Former  Substance Use Topics   Alcohol use: Yes    Comment: socially   Drug use: No     Allergies   Patient has no known allergies.   Review of Systems Review of Systems  Per HPI  Physical Exam Triage Vital Signs ED Triage Vitals  Encounter Vitals Group     BP 05/20/24  1255 115/80     Girls Systolic BP Percentile --      Girls Diastolic BP Percentile --      Boys Systolic BP Percentile --      Boys Diastolic BP Percentile --      Pulse Rate 05/20/24 1255 (!) 59     Resp 05/20/24 1255 16     Temp 05/20/24 1255 98.3 F (36.8 C)     Temp Source 05/20/24 1255 Oral     SpO2 05/20/24 1255 97 %     Weight --      Height --      Head Circumference --      Peak Flow --      Pain Score 05/20/24 1258 9     Pain Loc --      Pain Education --      Exclude from Growth Chart --    No data found.  Updated Vital Signs BP 115/80 (BP Location: Right Arm)   Pulse (!) 59   Temp 98.3 F (36.8 C) (Oral)   Resp 16   SpO2 97%   Visual Acuity Right Eye Distance:   Left Eye Distance:   Bilateral Distance:    Right Eye Near:   Left Eye Near:    Bilateral Near:     Physical Exam Vitals and nursing note reviewed.  Constitutional:      Appearance: Normal appearance.  HENT:     Head: Normocephalic and atraumatic.     Right Ear: External ear normal.     Left Ear: External ear normal.     Nose: Nose normal.     Mouth/Throat:     Mouth: Mucous membranes are moist.  Cardiovascular:     Rate and Rhythm: Normal rate.  Pulmonary:     Effort: Pulmonary effort is normal. No respiratory distress.  Musculoskeletal:        General: Tenderness present.     Left shoulder: Tenderness present. No bony tenderness or crepitus. Decreased range of motion. Decreased strength. Normal pulse.     Comments: Positive empty can test on the left.  Radial pulses 2+, brisk capillary refill.  Strong grip.  Without crepitus or bony deformity.  Skin:    General: Skin is warm and dry.  Neurological:     General: No focal deficit present.     Mental Status: She is alert and oriented to person, place, and time.  Psychiatric:        Mood and Affect: Mood normal.  Behavior: Behavior normal. Behavior is cooperative.      UC Treatments / Results  Labs (all labs ordered are  listed, but only abnormal results are displayed) Labs Reviewed - No data to display  EKG   Radiology DG Shoulder Left Result Date: 05/20/2024 EXAM: 1 VIEW XRAY OF THE LEFT SHOULDER 05/20/2024 01:41:07 PM COMPARISON: None available. CLINICAL HISTORY: Pain, work injury. FINDINGS: BONES AND JOINTS: Glenohumeral joint is normally aligned. No acute fracture or dislocation. There is calcification within the acromiohumeral interval likely related to chronic rotator cuff disease. SOFT TISSUES: No abnormal calcifications. Visualized lung is unremarkable. IMPRESSION: 1. Calcification within the acromiohumeral interval, likely related to chronic rotator cuff disease. Electronically signed by: evalene coho 05/20/2024 01:53 PM EDT RP Workstation: [id number]    Procedures Procedures (including critical care time)  Medications Ordered in UC Medications  dexamethasone  (DECADRON ) injection 10 mg (10 mg Intramuscular Given 05/20/24 1401)    Initial Impression / Assessment and Plan / UC Course  I have reviewed the triage vital signs and the nursing notes.  Pertinent labs & imaging results that were available during my care of the patient were reviewed by me and considered in my medical decision making (see chart for details).  Vitals and triage reviewed, patient is hemodynamically stable.  Left shoulder pain continues along the deltoid.  Positive empty can testing, suspicious for rotator cuff etiology.  Imaging by my interpretation does not show acute bony abnormality, does show calcifications consistent with chronic rotator cuff pathology, confirmed with radiology overread.  Suspect recent injury exacerbated chronic rotator cuff issues.  Will trial IM steroid and advised topical treatments with orthopedic follow-up.  Plan of care, follow-up care and return precautions given, no questions at this time.  Work note provided.    Final Clinical Impressions(s) / UC Diagnoses   Final diagnoses:  Acute  pain of left shoulder  Rotator cuff tendinitis, left     Discharge Instructions      Your x-ray showed no acute bony abnormality, did show evidence of chronic rotator cuff disease.  This may be exacerbated by your recent work injury.  The intramuscular steroid should work to help reduce inflammation.  For continued symptoms please follow-up with occupational health and an orthopedic.  Using topical Voltaren gel may be more effective than the muscle relaxer, can try lidocaine  patches as well.  Return to clinic for new or urgent symptoms.      ED Prescriptions   None    PDMP not reviewed this encounter.   Dreama Creta SAILOR, FNP 05/20/24 1434

## 2024-05-21 ENCOUNTER — Other Ambulatory Visit: Payer: Self-pay

## 2024-05-21 ENCOUNTER — Encounter: Payer: Self-pay | Admitting: Family Medicine

## 2024-05-21 ENCOUNTER — Ambulatory Visit (INDEPENDENT_AMBULATORY_CARE_PROVIDER_SITE_OTHER): Payer: Worker's Compensation | Admitting: Family Medicine

## 2024-05-21 VITALS — BP 118/80 | Ht 73.0 in

## 2024-05-21 DIAGNOSIS — Y990 Civilian activity done for income or pay: Secondary | ICD-10-CM

## 2024-05-21 DIAGNOSIS — M25512 Pain in left shoulder: Secondary | ICD-10-CM

## 2024-05-21 DIAGNOSIS — S46012A Strain of muscle(s) and tendon(s) of the rotator cuff of left shoulder, initial encounter: Secondary | ICD-10-CM

## 2024-05-21 NOTE — Progress Notes (Addendum)
 PCP: Pia Kerney SQUIBB, MD  Subjective:   CC: Lt shoulder pain  HPI: Wendy George is a 53 y.o. female with pertinent past medical history of gastric sleeve, as well as right shoulder rotator cuff repair here for left shoulder pain.    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  She has visited urgent care multiple times on 05/18/24 and 05/20/24 and had x-ray imaging done which did not reveal any fractures or dislocations but did show some calcification within the acromiohumeral interval.  Wendy George received Toradol  at her first urgent care visit, then steroids at her second urgent care visit which has been minimally helpful.  She states that she has been taking ibuprofen  which is helpful and she is aware that this is dangerous with her sleep.  She was prescribed baclofen  which has not really improved her pain but is made her more sleepy.  She does take Lyrica for her right shoulder chronic neuropathy which has been helpful.  Previous to this injury that she had, Wendy George could do her job without difficulty and did not have any pain in her left shoulder.  Past Medical History:  Diagnosis Date   Anxiety    Chronic lower back pain    Chronic pain in right shoulder    Depression    Headache    HX MIGRAINES YRS AGO   Hypertension    Sleep apnea    CPAP WITH AUTO SET   Urinary urgency     Current Outpatient Medications on File Prior to Visit  Medication Sig Dispense Refill   albuterol (PROVENTIL) (2.5 MG/3ML) 0.083% nebulizer solution 3 ml as needed     ALPRAZolam (XANAX) 0.5 MG tablet Take 0.5 mg by mouth every 6 (six) hours as needed for anxiety.     baclofen  (LIORESAL ) 10 MG tablet Take 1 tablet (10 mg total) by mouth 3 (three) times daily. 30 each 0   citalopram (CELEXA) 20 MG tablet Take 20 mg by mouth at bedtime.     pantoprazole  (PROTONIX ) 40 MG  tablet Take 1 tablet (40 mg total) by mouth daily. 90 tablet 0   pregabalin (LYRICA) 150 MG capsule Take 150 mg by mouth as needed.     SUMAtriptan (IMITREX) 100 MG tablet Take 100 mg by mouth every 2 (two) hours as needed for migraine. May repeat in 2 hours if headache persists or recurs.     topiramate (TOPAMAX) 100 MG tablet Take 100 mg by mouth at bedtime.     No current facility-administered medications on file prior to visit.    BP 118/80 (BP Location: Right Arm, Wendy George Position: Sitting)   Ht 6' 1 (1.854 m)   BMI 27.89 kg/m       Objective:  Physical Exam:  Gen: NAD, comfortable in exam room  Inspection: Left shoulder is free from effusion, erythema, edema or warmth Palpation: Wendy George has pain to palpation over the lateral proximal humerus, as well as the anterior proximal humerus ROM:  limited shoulder ROM actively & passively -- flexion to about 80 to 90 degrees, abduction limited to about 80 to 90 degrees due to pain, ER limited to about 15-deg due to pain.   Special Tests: Drop arm test positive on the left, empty can test positive on the left, pain and weakness with external and internal rotation, pushoff test  positive, crossover test positive, O'Brien test positive, Neer's and Hawkins positive Neuro: Grip strength 5/5 bilaterally, sensation intact bilaterally,  Vasc: pulses equal and intact bilaterally  IMAGING: MSK Complete US  of Shoulder Date: 05/21/2024 Location: Left shoulder Indication: Left shoulder injury Findings: Wendy George was seated on exam table and shoulder US  examination was performed using high frequency linear probe.  - Biceps tendon was visualized within the bicipital groove in both longitudinal and transverse axis with no signs of fluid or hypoechoic changes. Tendon fibers intact without signs of irregularity.  - Subscapularis tendon was visualized in both longitudinal and transverse axis.  Insertional footplate tear noted that appears to be nearly  full-thickness.  Also with associated hypoechoic changes with in the tendon.  No increased doppler flow. -  Supraspinatus tendon was visualized in longitudinal, transverse, and dynamic views.  Irregularity and tearing noted at the insertional fooplate.  Appears to have intrasubstance tear, as well as articularly sided tear.  Hypoechoic change noted within the tendon.  Prominent calcification noted in the tendon with associated acoustic shadowing.   - Infraspinatus and teres minor tendons were visualized in both longitudinal and transverse axis with tendon insertion at the middle facet of the great tubercle of the humerus with signs of tearing at the insertional footplate, hypoechoic changes present - Posterior glenohumeral joint was visualized with proper alignment, fluid present near the labrum - AC Joint was visualized without bursal distension but there are arthritic change   IMPRESSION:  Multiple rotator cuff tendon tearing and evidence of hypoechoic changes present, concern for multi-tendon involvement rotator cuff tear  U/S images and interpretation completed by Krystal Lowing, DO and Rainell Cedar, DO Images saved to PACS  Assessment & Plan:   1. Traumatic complete tear of left rotator cuff, initial encounter (Primary) - US  COMPLETE JOINT SPACE STRUCTURE UP LEFT; Future - MR SHOULDER LEFT WO CONTRAST; Future  2. Work related injury PLAN: - Physical exam and ultrasound testing with findings consistent with multiple tendon rotator cuff tearing from work related injury 05/15/24 - Will send Wendy George for MRI for possible surgical planning. - Wendy George can continue using Tylenol , tramadol , Lyrica and should avoid NSAIDs due to history of gastric sleeve.  She will reach out to PCP for any Tramadol  refills, ok to reach out to us  if having issues - given note to keep her out of work thru 06/04/24 while we continue to work-up her shoulder injury.   - Wendy George will follow-up after MRI to review  results  Krystal Lowing, DO Sports Medicine Fellow

## 2024-06-07 ENCOUNTER — Ambulatory Visit: Payer: Self-pay | Admitting: Skilled Nursing Facility1

## 2024-06-11 ENCOUNTER — Telehealth: Payer: Self-pay

## 2024-06-11 ENCOUNTER — Other Ambulatory Visit: Payer: Self-pay

## 2024-06-11 DIAGNOSIS — S46012A Strain of muscle(s) and tendon(s) of the rotator cuff of left shoulder, initial encounter: Secondary | ICD-10-CM

## 2024-06-11 NOTE — Telephone Encounter (Signed)
 Patient called office stating that her MRI got approved and that she is still out of work. Patient also states that she ha two places that her insurance will cover.  [facility] [phone] 2. [facility] [phone]

## 2024-06-22 ENCOUNTER — Telehealth: Payer: Self-pay | Admitting: *Deleted

## 2024-06-22 NOTE — Telephone Encounter (Signed)
Work letter created

## 2024-07-03 ENCOUNTER — Encounter: Payer: Self-pay | Admitting: Family Medicine

## 2024-07-03 ENCOUNTER — Ambulatory Visit (INDEPENDENT_AMBULATORY_CARE_PROVIDER_SITE_OTHER): Payer: Worker's Compensation | Admitting: Family Medicine

## 2024-07-03 ENCOUNTER — Other Ambulatory Visit: Payer: Self-pay

## 2024-07-03 VITALS — BP 112/68 | Ht 73.0 in

## 2024-07-03 DIAGNOSIS — M67912 Unspecified disorder of synovium and tendon, left shoulder: Secondary | ICD-10-CM

## 2024-07-03 DIAGNOSIS — M7532 Calcific tendinitis of left shoulder: Secondary | ICD-10-CM | POA: Diagnosis not present

## 2024-07-03 DIAGNOSIS — Y990 Civilian activity done for income or pay: Secondary | ICD-10-CM | POA: Diagnosis not present

## 2024-07-03 DIAGNOSIS — M7502 Adhesive capsulitis of left shoulder: Secondary | ICD-10-CM

## 2024-07-03 MED ORDER — METHYLPREDNISOLONE ACETATE 40 MG/ML IJ SUSP
40.0000 mg | Freq: Once | INTRAMUSCULAR | Status: AC
Start: 2024-07-03 — End: 2024-07-03
  Administered 2024-07-03: 40 mg via INTRA_ARTICULAR

## 2024-07-03 NOTE — Patient Instructions (Signed)

## 2024-07-03 NOTE — Progress Notes (Signed)
 DATE OF VISIT: 07/03/2024        Wendy George DOB: [birth year] MRN: [id number]  CC: Workup-follow-up left shoulder  History of present Illness: Wendy George is a 53 y.o. female who presents for a follow-up visit for left shoulder pain related to work injury sustained 05/15/2024 Last seen by me 05/21/24 -After that visit was referred for MRI of the left shoulder, this was completed 06/29/2024 with findings as noted below  Patient states she has not yet returned to work Has gotten an attorney involved with her case States she has started a new job working from home  She does have ongoing pain in the shoulder Difficulty lifting the shoulder Having trouble reaching behind her No improvement with baclofen  No improvement with meloxicam She does note prior history of right shoulder issues requiring shoulder surgery for calcification.  She notes had a normal MRI at that time, but during shoulder surgery was noted to have a rotator cuff tear  Medications:  Outpatient Encounter Medications as of 07/03/2024  Medication Sig   albuterol (PROVENTIL) (2.5 MG/3ML) 0.083% nebulizer solution 3 ml as needed   ALPRAZolam (XANAX) 0.5 MG tablet Take 0.5 mg by mouth every 6 (six) hours as needed for anxiety.   baclofen  (LIORESAL ) 10 MG tablet Take 1 tablet (10 mg total) by mouth 3 (three) times daily.   citalopram (CELEXA) 20 MG tablet Take 20 mg by mouth at bedtime.   pantoprazole  (PROTONIX ) 40 MG tablet Take 1 tablet (40 mg total) by mouth daily.   pregabalin (LYRICA) 150 MG capsule Take 150 mg by mouth as needed.   SUMAtriptan (IMITREX) 100 MG tablet Take 100 mg by mouth every 2 (two) hours as needed for migraine. May repeat in 2 hours if headache persists or recurs.   topiramate (TOPAMAX) 100 MG tablet Take 100 mg by mouth at bedtime.   [EXPIRED] methylPREDNISolone  acetate (DEPO-MEDROL ) injection 40 mg    No facility-administered encounter medications on file as of 07/03/2024.    Allergies: has no  known allergies.  Physical Examination: Vitals: BP 112/68   Ht 6' 1 (1.854 m)   BMI 27.89 kg/m  GENERAL:  Wendy George is a 53 y.o. female appearing their stated age, alert and oriented x 3, in no apparent distress.  SKIN: no rashes or lesions, skin clean, dry, intact MSK: Shoulder: Left shoulder without gross deformity.  Diffusely tender to palpation throughout the shoulder.  Significantly decreased active and passive range of motion limited to 90 degrees of flexion, 75 degrees abduction, 5 degrees external rotation all with significant pain.  Rotator cuff strength 4/5 throughout. Right shoulder full range of motion pain, weakness, instability Neurovascular intact distally  Radiology: MRI left shoulder 06/29/2024 showing: 1.  Low-attenuation findings within the supraspinatus tendon near the footprint may represent calcific tendinitis.  Consider correlation with plain radiographs 2.  Supraspinatus and subscapularis tendinosis 3.  Degenerative glenoid labral fraying 4.  Mild subacromial spurring and bursitis 5.  Findings compatible with adhesive capsulitis in the correct clinical setting Assessment & Plan Adhesive capsulitis of left shoulder Acute left shoulder pain status post work-related injury 05/15/2024, patient was previously performing her job duties without any shoulder issues or shoulder pain. - Physical exam and MRI consistent with frozen shoulder with underlying rotator cuff tendinopathy and calcific tendinosis  Plan: - MRI results reviewed.  Treatment options discussed including trial of ultrasound-guided injection, physical therapy, referral to surgeon.  Patient states would like to proceed with all - Ultrasound-guided cortisone injection  was completed as noted below.  Tolerated procedure well - Will refer her to physical therapy at: [facility] location - Since she did have prior right shoulder issues with calcific tendinosis and underlying tear that was noted at  time of surgery, she would like to see a surgeon.  Referral to Dr. [facility] placed - Should follow-up with me in 4 to 6 weeks for reevaluation or sooner as needed - Not yet ready to return to work  PROCEDURE:  Risks & benefits of left shoulder u/s guided GH joint injection reviewed. Consent obtained. Time-out completed. Patient prepped and draped in the normal fashion. Musculoskeletal ultrasound used to identify appropriate anatomy.  Left GH joint well identified posteriorly, no signs of effusion. After identifying appropriate anatomy, patient positioned & area cleansed with chlorhexadine. Ethyl chloride spray used to anesthetize the skin. Solution of 3 mL 1% lidocaine  injected under u/s guidance for local anesthesia using 22-gauge 1.5 inch needle. After ensuring adequate anesthesia a solution of 7 mL 1% lidocaine  with 1 mL methylprednisolone  (Depo-medrol ) 40mg /mL injected into the left glenohumeral joint using a 22-gauge 3.5-inch needle via posterior approach under ultrasound guidance. Needle well-visualized in the joint. Images saved. Patient tolerated procedure well without any complications.  Area covered with adhesive bandage. Post-procedure care reviewed, all questions answered. Images, procedure, and interpretation completed by Payton Coward, MD PGY3 and  Rainell Cedar, DO    Tendinopathy of left rotator cuff Acute left shoulder pain status post work-related injury 05/15/2024, patient was previously performing her job duties without any shoulder issues or shoulder pain. 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  She will continue to follow-up with Worker's Comp. team as directed   Patient expressed understanding & agreement with above.  Encounter Diagnoses  Name Primary?   Adhesive capsulitis of left shoulder Yes   Tendinopathy of left rotator cuff    Calcific tendonitis of left shoulder    Work related injury     Orders Placed This Encounter  Procedures   US  LIMITED JOINT SPACE STRUCTURES UP LEFT   Ambulatory referral to Physical Therapy   Ambulatory referral to Orthopedic Surgery

## 2024-07-05 ENCOUNTER — Ambulatory Visit

## 2024-07-05 ENCOUNTER — Ambulatory Visit: Attending: Family Medicine | Admitting: Physical Therapy

## 2024-07-05 DIAGNOSIS — M25612 Stiffness of left shoulder, not elsewhere classified: Secondary | ICD-10-CM | POA: Insufficient documentation

## 2024-07-05 DIAGNOSIS — M25512 Pain in left shoulder: Secondary | ICD-10-CM | POA: Insufficient documentation

## 2024-07-05 NOTE — Therapy (Incomplete)
 OUTPATIENT PHYSICAL THERAPY UPPER EXTREMITY EVALUATION   Patient Name: Wendy George MRN: [id number] DOB:11/27/1970, 53 y.o., female Today's Date: 07/05/2024  END OF SESSION:   Past Medical History:  Diagnosis Date   Anxiety    Chronic lower back pain    Chronic pain in right shoulder    Depression    Headache    HX MIGRAINES YRS AGO   Hypertension    Sleep apnea    CPAP WITH AUTO SET   Urinary urgency    Past Surgical History:  Procedure Laterality Date   LAPAROSCOPIC GASTRIC SLEEVE RESECTION N/A 09/18/2018   Procedure: LAPAROSCOPIC GASTRIC SLEEVE RESECTION, UPPER ENDO, ERAS Pathway;  Surgeon: Signe Mitzie LABOR, MD;  Location: [facility];  Service: General;  Laterality: N/A;   ROTATOR CUFF REPAIR Right 11/10/2015   TUBAL LIGATION     tubal reversal     Patient Active Problem List   Diagnosis Date Noted   Morbid obesity (HCC) 09/18/2018   Varicose veins of bilateral lower extremities with other complications 01/24/2017   Spider veins of both lower extremities 01/24/2017    PCP: Pia Kerney SQUIBB, MD   REFERRING PROVIDER: Teressa Rainell BROCKS, DO  REFERRING DIAG: M75.02 (ICD-10-CM) - Adhesive capsulitis of left shoulder  THERAPY DIAG:  No diagnosis found.  Rationale for Evaluation and Treatment: Rehabilitation  ONSET DATE: ***  SUBJECTIVE:                                                                                                                                                                                      SUBJECTIVE STATEMENT: *** Hand dominance: {MISC; OT HAND DOMINANCE:[phone]}  PERTINENT HISTORY: Anxiety; Depression; HTN; Chronic LBP  PAIN:  Are you having pain? Yes: NPRS scale: *** Pain location: *** Pain description: *** Aggravating factors: *** Relieving factors: ***  PRECAUTIONS: {Therapy precautions:24002}  RED FLAGS: {PT Red Flags:29287}   WEIGHT BEARING RESTRICTIONS: {Yes ***/No:24003}  FALLS:  Has patient fallen in last 6  months? {fallsyesno:27318}  LIVING ENVIRONMENT: Lives with: {[facility] lives with:25569::lives with their family} Lives in: {Lives in:25570} Stairs: {opstairs:27293} Has following equipment at home: {Assistive devices:23999}  OCCUPATION: ***  PLOF: {PLOF:24004}  PATIENT GOALS: ***  NEXT MD VISIT: ***  OBJECTIVE:  Note: Objective measures were completed at Evaluation unless otherwise noted.  DIAGNOSTIC FINDINGS:  05/20/2024  BONES AND JOINTS: Glenohumeral joint is normally aligned. No acute fracture or dislocation. There is calcification within the acromiohumeral interval likely related to chronic rotator cuff disease.   SOFT TISSUES: No abnormal calcifications. Visualized lung is unremarkable.   IMPRESSION: 1. Calcification within the acromiohumeral interval, likely related to chronic rotator cuff disease.  PATIENT SURVEYS :  QuickDASH:   COGNITION: Overall cognitive status: {cognition:24006}     SENSATION: {sensation:27233}  POSTURE: ***  UPPER EXTREMITY ROM:   {AROM/PROM:27142} ROM Right eval Left eval  Shoulder flexion    Shoulder extension    Shoulder abduction    Shoulder adduction    Shoulder internal rotation    Shoulder external rotation    Elbow flexion    Elbow extension    Wrist flexion    Wrist extension    Wrist ulnar deviation    Wrist radial deviation    Wrist pronation    Wrist supination    (Blank rows = not tested)  UPPER EXTREMITY MMT:  MMT Right eval Left eval  Shoulder flexion    Shoulder extension    Shoulder abduction    Shoulder adduction    Shoulder internal rotation    Shoulder external rotation    Middle trapezius    Lower trapezius    Elbow flexion    Elbow extension    Wrist flexion    Wrist extension    Wrist ulnar deviation    Wrist radial deviation    Wrist pronation    Wrist supination    Grip strength (lbs)    (Blank rows = not tested)  SHOULDER SPECIAL TESTS: Impingement tests: {shoulder  impingement test:25231:a} SLAP lesions: {SLAP lesions:25232} Instability tests: {shoulder instability test:25233} Rotator cuff assessment: {rotator cuff assessment:25234} Biceps assessment: {biceps assessment:25235}  JOINT MOBILITY TESTING:  ***  PALPATION:  ***                                                                                                                             TREATMENT DATE:  07/05/2024 Initial Evaluation & HEP created   PATIENT EDUCATION: Education details: PT eval findings, anticipated POC, progress with PT, and initial HEP Person educated: Patient Education method: Explanation, Demonstration, and Handouts Education comprehension: verbalized understanding, returned demonstration, and needs further education  HOME EXERCISE PROGRAM: ***  ASSESSMENT:  CLINICAL IMPRESSION: Patient is a *** y.o. *** who was seen today for physical therapy evaluation and treatment for ***.    OBJECTIVE IMPAIRMENTS: {opptimpairments:25111}.   ACTIVITY LIMITATIONS: {activitylimitations:27494}  PARTICIPATION LIMITATIONS: {participationrestrictions:25113}  PERSONAL FACTORS: {Personal factors:25162} are also affecting patient's functional outcome.   REHAB POTENTIAL: {rehabpotential:25112}  CLINICAL DECISION MAKING: {clinical decision making:25114}  EVALUATION COMPLEXITY: {Evaluation complexity:25115}  GOALS: Goals reviewed with patient? {yes/no:20286}  SHORT TERM GOALS: Target date: ***  *** Baseline: Goal status: {GOALSTATUS:25110}  2.  *** Baseline:  Goal status: {GOALSTATUS:25110}  3.  *** Baseline:  Goal status: {GOALSTATUS:25110}  4.  *** Baseline:  Goal status: {GOALSTATUS:25110}  5.  *** Baseline:  Goal status: {GOALSTATUS:25110}  6.  *** Baseline:  Goal status: {GOALSTATUS:25110}  LONG TERM GOALS: Target date: ***  *** Baseline:  Goal status: {GOALSTATUS:25110}  2.  *** Baseline:  Goal status: {GOALSTATUS:25110}  3.   *** Baseline:  Goal status: {GOALSTATUS:25110}  4.  *** Baseline:  Goal status: {  GOALSTATUS:25110}  5.  *** Baseline:  Goal status: {GOALSTATUS:25110}  6.  *** Baseline:  Goal status: {GOALSTATUS:25110}  PLAN: PT FREQUENCY: {rehab frequency:25116}  PT DURATION: {rehab duration:25117}  PLANNED INTERVENTIONS: {rehab planned interventions:25118::97110-Therapeutic exercises,97530- Therapeutic [phone]- Neuromuscular re-education,97535- Self Rjmz,02859- Manual therapy,Patient/Family education}  PLAN FOR NEXT SESSION: PIERRETTE Kristeen Sar, PT 07/05/2024, 12:22 PM

## 2024-07-08 NOTE — Therapy (Signed)
 OUTPATIENT PHYSICAL THERAPY SHOULDER EVALUATION   Patient Name: Wendy George MRN: [id number] DOB:03-Oct-1971, 53 y.o., female Today's Date: 07/09/2024  END OF SESSION:  PT End of Session - 07/09/24 1217     Visit Number 1    Number of Visits 11    Date for Recertification  08/13/24    Authorization Type needed    PT Start Time 0815    PT Stop Time 0856    PT Time Calculation (min) 41 min    Activity Tolerance Patient limited by pain    Behavior During Therapy WFL for tasks assessed/performed          Past Medical History:  Diagnosis Date   Anxiety    Chronic lower back pain    Chronic pain in right shoulder    Depression    Headache    HX MIGRAINES YRS AGO   Hypertension    Sleep apnea    CPAP WITH AUTO SET   Urinary urgency    Past Surgical History:  Procedure Laterality Date   LAPAROSCOPIC GASTRIC SLEEVE RESECTION N/A 09/18/2018   Procedure: LAPAROSCOPIC GASTRIC SLEEVE RESECTION, UPPER ENDO, ERAS Pathway;  Surgeon: Signe Mitzie LABOR, MD;  Location: [facility];  Service: General;  Laterality: N/A;   ROTATOR CUFF REPAIR Right 11/10/2015   TUBAL LIGATION     tubal reversal     Patient Active Problem List   Diagnosis Date Noted   Morbid obesity (HCC) 09/18/2018   Varicose veins of bilateral lower extremities with other complications 01/24/2017   Spider veins of both lower extremities 01/24/2017    PCP: Kerney Footman, MD  REFERRING PROVIDER: Rainell Cedar, DO  REFERRING DIAG:  Diagnosis  M75.02 (ICD-10-CM) - Adhesive capsulitis of left shoulder    THERAPY DIAG:  Acute pain of left shoulder  Stiffness of left shoulder, not elsewhere classified  Rationale for Evaluation and Treatment: Rehabilitation  ONSET DATE: 05/15/24  SUBJECTIVE:                                                                                                                                                                                      SUBJECTIVE STATEMENT: Lt shoulder pain  related to work injury 05/15/24 - 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  I felt like it popped when I was lifting him. Cortisone injection 07/03/24. The injection did not help any of the pain.    Hand dominance: Right  PERTINENT HISTORY: No other health problems.  Pt reports in the Rt shoulder she had calcification noted and they went in for surgery and she had a tear underneath it. They  maybe noted a tear in the US  during the injection.    PAIN:  Are you having pain? Yes: NPRS scale: 5/10 - 9/10 Pain location: left deltoid region Pain description: shooting pain  Aggravating factors: rolling on the left, raising the arm  Relieving factors: ice and heat   PRECAUTIONS: None  RED FLAGS: None   WEIGHT BEARING RESTRICTIONS: No  FALLS:  Has patient fallen in last 6 months? 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    PLOF: Independent  PATIENT GOALS:  NEXT MD VISIT: 10/28, 10/9 with Dr. Elana  OBJECTIVE:  Note: Objective measures were completed at Evaluation unless otherwise noted.  DIAGNOSTIC FINDINGS:  MRI 06/29/24:   Low-attenuation findings within the supraspinatus tendon near the footprint may represent calcific tendinitis.  Consider correlation with plain radiographs 2.  Supraspinatus and subscapularis tendinosis 3.  Degenerative glenoid labral fraying 4.  Mild subacromial spurring and bursitis 5.  Findings compatible with adhesive capsulitis in the correct clinical setting  PATIENT SURVEYS:  Quick Dash:  QUICK DASH  Please rate your ability do the following activities in the last week by selecting the number below the appropriate response.   Activities Rating  Open a tight or new jar.  4 = Severe difficulty  Do heavy household chores (e.g., wash walls, floors). 5 =  Unable  Carry a shopping bag or briefcase 1 = No difficulty   Wash your back. 5=unable   Use a knife to cut food. 1 = No difficulty   Recreational activities in which you take some force or impact through your arm, shoulder or hand (e.g., golf, hammering, tennis, etc.). 5 = Unable  During the past week, to what extent has your arm, shoulder or hand problem interfered with your normal social activities with family, friends, neighbors or groups?  3 = Moderately  During the past week, were you limited in your work or other regular daily activities as a result of your arm, shoulder or hand problem? 4 = Very limited  Rate the severity of the following symptoms in the last week: Arm, Shoulder, or hand pain. 3 = Moderate  Rate the severity of the following symptoms in the last week: Tingling (pins and needles) in your arm, shoulder or hand. 3 = Moderate  During the past week, how much difficulty have you had sleeping because of the pain in your arm, shoulder or hand?  4 = Severe difficulty   (A QuickDASH score may not be calculated if there is greater than 1 missing item.)  Quick Dash Disability/Symptom Score: [(sum of 38 (n) responses/11 (n)] x 25 = 86  Minimally Clinically Important Difference (MCID): 15-20 points  (Franchignoni, F. et al. (2013). Minimally clinically important difference of the disabilities of the arm, shoulder, and hand outcome measures (DASH) and its shortened version (Quick DASH). Journal of Orthopaedic & Sports Physical Therapy, 44(1), 30-39)   COGNITION: Overall cognitive status: Within functional limits for tasks assessed     SENSATION: WFL  POSTURE: Guarded    UPPER EXTREMITY ROM:   Active / Passive ROM Right eval Left eval  Shoulder flexion 120 PROM: 50 stopping before pain AROM: 74  Shoulder extension 48  p! 28  Shoulder abduction 120 AROM: 30 PROM: 56  Shoulder adduction    Shoulder internal rotation    Shoulder external rotation  Elbow flexion     Elbow extension    Wrist flexion    Wrist extension    Wrist ulnar deviation    Wrist radial deviation    Wrist pronation    Wrist supination    (Blank rows = not tested)  UPPER EXTREMITY MMT:  MMT Right eval Left eval  Shoulder flexion    Shoulder extension    Shoulder abduction    Shoulder adduction    Shoulder internal rotation    Shoulder external rotation    Middle trapezius    Lower trapezius    Elbow flexion    Elbow extension    Wrist flexion    Wrist extension    Wrist ulnar deviation    Wrist radial deviation    Wrist pronation    Wrist supination    Grip strength (lbs)    (Blank rows = not tested)  PALPATION:  +2 ttp Lt shoulder globally and to Lt UT                                                                                                                             TREATMENT DATE:  Eval performed - limited by pain Educated pt on activities to try at home to try and improve posture and positioning Lt UT stretch x 20, scapular retractions x 10, pendulum with opposite hand on the countertop - pt reporting significant increase in pain levels with pendulum.   Education on POC and using ice if needed at home for pain relief if sore later   PATIENT EDUCATION: Education details: per today's note Person educated: Patient Education method: Programmer, multimedia, Facilities manager, and Handouts Education comprehension: verbalized understanding and returned demonstration  HOME EXERCISE PROGRAM: Access Code: [email or web address] URL: [email or web address] Date: 07/09/2024 Prepared by: Saddie Raw  Exercises - Seated Cervical Sidebending Stretch  - 1-3 x daily - 7 x weekly - 1 sets - 3 reps - 20-30 seconds hold - Seated Scapular Retraction  - 1-3 x daily - 7 x weekly - 1-3 sets - 10 reps - 3 sec  hold - Circular Shoulder Pendulum with Table Support  - 1 x daily - 7 x weekly - 1-3 sets - 20-30 seconds hold  ASSESSMENT:  CLINICAL IMPRESSION: Patient is a 53  y.o. female who was seen today for physical therapy evaluation and treatment for her left shoulder pain and stiffness after an injury at work.  Pain was the limiting factor with assessment and exercise prescription today.  Even gentle pendulums and scapular retraction increased her pain. Pt was encouraged to perform postural changes and gentle stretches to see if they are tolerated to allow for improvements in the pain.  She will be seeing orthopedic surgery 10/8 and we can see what the directions are for this.     OBJECTIVE IMPAIRMENTS: decreased activity tolerance, decreased ROM, and decreased strength.   ACTIVITY LIMITATIONS: carrying, lifting, and reach over head  PARTICIPATION LIMITATIONS: meal prep, cleaning, laundry, driving,  shopping, community activity, occupation, and yard work  PERSONAL FACTORS: Time since onset of injury/illness/exacerbation are also affecting patient's functional outcome.   REHAB POTENTIAL: Good  CLINICAL DECISION MAKING: Evolving/moderate complexity  EVALUATION COMPLEXITY: Moderate   GOALS: Goals reviewed with patient? Yes  SHORT TERM GOALS: Target date: 07/09/24  Pt will be ind with initial movements for postural changes and gentle movement  Baseline: Goal status: MET     LONG TERM GOALS: Target date: 08/13/24  Pt will decrease resting pain to 1/10 or less  Baseline: 5/10 Goal status: INITIAL  2.  Pt will improve left shoulder AROM to at least equal to the Rt arm to demonstrate improvements in reach (120) Baseline: flex: 74 and abd: 56  Goal status: INITIAL  3.  Pt will be able to sleep in a bed vs the recliner due to decreased levels of shoulder pain Baseline:  Goal status: INITIAL   PLAN:  PT FREQUENCY: 2x/week  PT DURATION: 5 weeks   PLANNED INTERVENTIONS: 97110-Therapeutic exercises, 97530- Therapeutic activity, [id number]- Neuromuscular re-education, 97535- Self Care, [id number]- Manual therapy, [id number]- Electrical stimulation (unattended),  [phone]- Ionotophoresis 4mg /ml Dexamethasone , Patient/Family education, Taping, Cryotherapy, and Moist heat  PLAN FOR NEXT SESSION: left shoulder PROM (gentle) pulleys if able, pain relief as needed   Larue Saddie SAUNDERS, PT 07/09/2024, 12:18 PM

## 2024-07-09 ENCOUNTER — Ambulatory Visit: Admitting: Rehabilitation

## 2024-07-09 ENCOUNTER — Other Ambulatory Visit: Payer: Self-pay

## 2024-07-09 ENCOUNTER — Encounter: Payer: Self-pay | Admitting: Rehabilitation

## 2024-07-09 DIAGNOSIS — M25512 Pain in left shoulder: Secondary | ICD-10-CM | POA: Diagnosis present

## 2024-07-09 DIAGNOSIS — M25612 Stiffness of left shoulder, not elsewhere classified: Secondary | ICD-10-CM

## 2024-07-26 ENCOUNTER — Ambulatory Visit (HOSPITAL_BASED_OUTPATIENT_CLINIC_OR_DEPARTMENT_OTHER): Admitting: Orthopaedic Surgery

## 2024-08-14 ENCOUNTER — Ambulatory Visit: Payer: Self-pay | Admitting: Family Medicine

## 2024-08-20 ENCOUNTER — Encounter: Payer: Self-pay | Admitting: Radiology

## 2024-09-01 ENCOUNTER — Emergency Department (HOSPITAL_BASED_OUTPATIENT_CLINIC_OR_DEPARTMENT_OTHER): Admitting: Radiology

## 2024-09-01 ENCOUNTER — Encounter (HOSPITAL_BASED_OUTPATIENT_CLINIC_OR_DEPARTMENT_OTHER): Payer: Self-pay

## 2024-09-01 ENCOUNTER — Emergency Department (HOSPITAL_BASED_OUTPATIENT_CLINIC_OR_DEPARTMENT_OTHER)
Admission: EM | Admit: 2024-09-01 | Discharge: 2024-09-01 | Disposition: A | Discharge status: Home / Self Care | Attending: Emergency Medicine | Admitting: Emergency Medicine

## 2024-09-01 DIAGNOSIS — S161XXA Strain of muscle, fascia and tendon at neck level, initial encounter: Secondary | ICD-10-CM | POA: Insufficient documentation

## 2024-09-01 DIAGNOSIS — S46912A Strain of unspecified muscle, fascia and tendon at shoulder and upper arm level, left arm, initial encounter: Secondary | ICD-10-CM

## 2024-09-01 DIAGNOSIS — Y92410 Unspecified street and highway as the place of occurrence of the external cause: Secondary | ICD-10-CM | POA: Insufficient documentation

## 2024-09-01 DIAGNOSIS — S46012A Strain of muscle(s) and tendon(s) of the rotator cuff of left shoulder, initial encounter: Secondary | ICD-10-CM | POA: Insufficient documentation

## 2024-09-01 DIAGNOSIS — I10 Essential (primary) hypertension: Secondary | ICD-10-CM | POA: Insufficient documentation

## 2024-09-01 DIAGNOSIS — S39012A Strain of muscle, fascia and tendon of lower back, initial encounter: Secondary | ICD-10-CM | POA: Insufficient documentation

## 2024-09-01 DIAGNOSIS — M545 Low back pain, unspecified: Secondary | ICD-10-CM | POA: Diagnosis present

## 2024-09-01 MED ORDER — DEXAMETHASONE SOD PHOSPHATE PF 10 MG/ML IJ SOLN
10.0000 mg | Freq: Once | INTRAMUSCULAR | Status: AC
  Administered 2024-09-01: 10 mg via INTRAVENOUS

## 2024-09-01 MED ORDER — KETOROLAC TROMETHAMINE 30 MG/ML IJ SOLN
30.0000 mg | Freq: Once | INTRAMUSCULAR | Status: AC
  Administered 2024-09-01: 30 mg via INTRAVENOUS
  Filled 2024-09-01: qty 1

## 2024-09-01 MED ORDER — PREDNISONE 50 MG PO TABS: 50.0000 mg | ORAL_TABLET | Freq: Every day | ORAL | 0 refills | Status: AC

## 2024-09-01 MED ORDER — METHOCARBAMOL 500 MG PO TABS: 500.0000 mg | ORAL_TABLET | Freq: Two times a day (BID) | ORAL | 0 refills | Status: AC | PRN

## 2024-09-01 MED ORDER — DIAZEPAM 5 MG/ML IJ SOLN
2.5000 mg | Freq: Once | INTRAMUSCULAR | Status: AC
  Administered 2024-09-01: 2.5 mg via INTRAVENOUS
  Filled 2024-09-01: qty 2
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
## 2024-10-21 ENCOUNTER — Other Ambulatory Visit: Payer: Self-pay

## 2024-10-21 DIAGNOSIS — X58XXXA Exposure to other specified factors, initial encounter: Secondary | ICD-10-CM | POA: Insufficient documentation

## 2024-10-21 DIAGNOSIS — S0501XA Injury of conjunctiva and corneal abrasion without foreign body, right eye, initial encounter: Secondary | ICD-10-CM | POA: Insufficient documentation

## 2024-10-21 NOTE — ED Triage Notes (Signed)
 Pt from home via POV with son.  Pt states she removed contacts at 1500 and scratched her R eye when removing contacts.  RN notes watering and redness in R eye.  Pt states pain is 8/10, and is sensitive to light in that eye.

## 2024-10-22 ENCOUNTER — Emergency Department (HOSPITAL_BASED_OUTPATIENT_CLINIC_OR_DEPARTMENT_OTHER)
Admission: EM | Admit: 2024-10-22 | Discharge: 2024-10-22 | Disposition: A | Attending: Emergency Medicine | Admitting: Emergency Medicine

## 2024-10-22 DIAGNOSIS — S0501XA Injury of conjunctiva and corneal abrasion without foreign body, right eye, initial encounter: Secondary | ICD-10-CM

## 2024-10-22 DIAGNOSIS — H5711 Ocular pain, right eye: Secondary | ICD-10-CM

## 2024-10-22 MED ORDER — ACETAMINOPHEN 500 MG PO TABS
1000.0000 mg | ORAL_TABLET | Freq: Once | ORAL | Status: AC
  Administered 2024-10-22: 1000 mg via ORAL
  Filled 2024-10-22: qty 2

## 2024-10-22 MED ORDER — OFLOXACIN 0.3 % OP SOLN
1.0000 [drp] | Freq: Four times a day (QID) | OPHTHALMIC | Status: DC
  Administered 2024-10-22: 1 [drp] via OPHTHALMIC
  Filled 2024-10-22: qty 5

## 2024-10-22 MED ORDER — TETRACAINE HCL 0.5 % OP SOLN
2.0000 [drp] | Freq: Once | OPHTHALMIC | Status: AC
  Administered 2024-10-22: 2 [drp] via OPHTHALMIC
  Filled 2024-10-22: qty 4

## 2024-10-22 MED ORDER — KETOROLAC TROMETHAMINE 15 MG/ML IJ SOLN
30.0000 mg | Freq: Once | INTRAMUSCULAR | Status: AC
  Administered 2024-10-22: 30 mg via INTRAMUSCULAR
  Filled 2024-10-22: qty 2

## 2024-10-22 MED ORDER — FLUORESCEIN SODIUM 1 MG OP STRP
1.0000 | ORAL_STRIP | Freq: Once | OPHTHALMIC | Status: AC
  Administered 2024-10-22: 1 via OPHTHALMIC
  Filled 2024-10-22: qty 1

## 2024-10-22 NOTE — ED Provider Notes (Signed)
 " [place] EMERGENCY DEPARTMENT AT [facility] Provider Note   CSN: [id number] Arrival date & time: 10/21/24  2350     History Chief Complaint  Patient presents with   Eye Pain    HPI Wendy George is a 54 y.o. female presenting for severe right eye pain.  States she was taking out her contacts last night when she missed and recently poked herself in the eye.  Immediate pain at the site.  Watering.  Unable to see.  States she got the contact out. Denies fevers chills nausea vomiting syncope shortness of breath..   Patient's recorded medical, surgical, social, medication list and allergies were reviewed in the Snapshot window as part of the initial history.   Review of Systems   Review of Systems  Constitutional:  Negative for chills and fever.  HENT:  Negative for ear pain and sore throat.   Eyes:  Positive for pain. Negative for visual disturbance.  Respiratory:  Negative for cough and shortness of breath.   Cardiovascular:  Negative for chest pain and palpitations.  Gastrointestinal:  Negative for abdominal pain and vomiting.  Genitourinary:  Negative for dysuria and hematuria.  Musculoskeletal:  Negative for arthralgias and back pain.  Skin:  Negative for color change and rash.  Neurological:  Negative for seizures and syncope.  All other systems reviewed and are negative.   Physical Exam Updated Vital Signs BP 119/62   Pulse (!) 57   Temp 98.6 F (37 C) (Oral)   Resp 16   Ht 6' 1 (1.854 m)   Wt 97.5 kg   SpO2 100%   BMI 28.37 kg/m  Physical Exam Vitals and nursing note reviewed.  Constitutional:      General: She is not in acute distress.    Appearance: She is well-developed.  HENT:     Head: Normocephalic and atraumatic.  Cardiovascular:     Rate and Rhythm: Normal rate and regular rhythm.     Heart sounds: No murmur heard. Pulmonary:     Effort: Pulmonary effort is normal. No respiratory distress.     Breath sounds: Normal breath sounds.   Abdominal:     General: There is no distension.     Palpations: Abdomen is soft.     Tenderness: There is no abdominal tenderness. There is no right CVA tenderness or left CVA tenderness.  Musculoskeletal:        General: No swelling or tenderness. Normal range of motion.     Cervical back: Neck supple.  Skin:    General: Skin is warm and dry.  Neurological:     General: No focal deficit present.     Mental Status: She is alert and oriented to person, place, and time. Mental status is at baseline.     Cranial Nerves: No cranial nerve deficit.      ED Course/ Medical Decision Making/ A&P    Procedures Procedures   Medications Ordered in ED Medications  ofloxacin  (OCUFLOX ) 0.3 % ophthalmic solution 1 drop (1 drop Right Eye Given 10/22/24 0447)  tetracaine  (PONTOCAINE) 0.5 % ophthalmic solution 2 drop (2 drops Right Eye Given by Other 10/22/24 0445)  fluorescein  ophthalmic strip 1 strip (1 strip Both Eyes Given by Other 10/22/24 0446)  acetaminophen  (TYLENOL ) tablet 1,000 mg (1,000 mg Oral Given 10/22/24 0446)  ketorolac  (TORADOL ) 15 MG/ML injection 30 mg (30 mg Intramuscular Given 10/22/24 0447)    Medical Decision Making:   54 year old female presenting with a chief complaint of right  eye pain after exchanging contacts and excellently poking her in her eye. External exam reveals a very irritated conjunctivo-.  She has been applying drops and other medications to her eye all night. She has a mild photophobia but has grossly intact vision. External exam was performed.  Substantial conjunctivitis.  Right eye only. All extraocular motions are intact. Field of vision is all appropriate. Patient endorsing blurriness of the right eye. Fluorescein  exam demonstrates a 5% corneal abrasion directly over the pupil.  This is likely the etiology of both her pain and her visual disturbance.  Does not appear to be ulcerating and has a negative Sidel sign at this time.  Patient's pain improved with  administration of tetracaine . Tono-Pen was still performed and demonstrated IO pressure of 20 with a 95% confidence interval Discussed with the patient that given her degree of pain, she should follow-up with ophthalmologist within 48 hours.  Provided a referral to the [facility].  Will treat with ofloxacin  for broad coverage of infectious pathogens given use of contacts. Supportive care medications reinforced and patient expressed understanding.  Clinical Impression:  1. Pain of right eye   2. Abrasion of right cornea, initial encounter      Discharge   Final Clinical Impression(s) / ED Diagnoses Final diagnoses:  Pain of right eye  Abrasion of right cornea, initial encounter    Rx / DC Orders ED Discharge Orders     None         Jerral Meth, MD 10/22/24 [phone]  "

## 2024-11-19 ENCOUNTER — Encounter: Payer: Self-pay | Admitting: Gastroenterology

## 2025-02-28 ENCOUNTER — Ambulatory Visit: Admitting: Neurology
